# Patient Record
Sex: Male | Born: 1965 | Race: White | Hispanic: No | Marital: Married | Smoking: Never smoker
Health system: Southern US, Community
[De-identification: ages and names within clinical notes are randomized; demographics above are authoritative.]

## PROBLEM LIST (undated history)

## (undated) DIAGNOSIS — N183 Chronic kidney disease, stage 3 unspecified: Secondary | ICD-10-CM

## (undated) DIAGNOSIS — I10 Essential (primary) hypertension: Secondary | ICD-10-CM

## (undated) DIAGNOSIS — I4892 Unspecified atrial flutter: Secondary | ICD-10-CM

## (undated) DIAGNOSIS — K469 Unspecified abdominal hernia without obstruction or gangrene: Secondary | ICD-10-CM

## (undated) DIAGNOSIS — N200 Calculus of kidney: Secondary | ICD-10-CM

## (undated) DIAGNOSIS — I517 Cardiomegaly: Secondary | ICD-10-CM

## (undated) DIAGNOSIS — R0683 Snoring: Secondary | ICD-10-CM

## (undated) HISTORY — PX: HERNIA REPAIR: SHX51

## (undated) HISTORY — DX: Morbid (severe) obesity due to excess calories: E66.01

## (undated) HISTORY — PX: KNEE SURGERY: SHX244

## (undated) HISTORY — DX: Snoring: R06.83

## (undated) HISTORY — DX: Unspecified atrial flutter: I48.92

## (undated) HISTORY — DX: Cardiomegaly: I51.7

## (undated) HISTORY — DX: Chronic kidney disease, stage 3 unspecified: N18.30

## (undated) HISTORY — PX: HAND SURGERY: SHX662

---

## 2009-07-20 ENCOUNTER — Emergency Department (HOSPITAL_COMMUNITY): Admission: EM | Admit: 2009-07-20 | Discharge: 2009-07-21 | Payer: Self-pay | Admitting: Emergency Medicine

## 2009-10-15 ENCOUNTER — Emergency Department (HOSPITAL_COMMUNITY): Admission: EM | Admit: 2009-10-15 | Discharge: 2009-10-16 | Payer: Self-pay | Admitting: Emergency Medicine

## 2010-01-20 ENCOUNTER — Emergency Department (HOSPITAL_COMMUNITY)
Admission: EM | Admit: 2010-01-20 | Discharge: 2010-01-20 | Payer: Self-pay | Source: Home / Self Care | Admitting: Emergency Medicine

## 2010-01-22 LAB — CBC
HCT: 38.3 % — ABNORMAL LOW (ref 39.0–52.0)
Hemoglobin: 13.2 g/dL (ref 13.0–17.0)
MCH: 30.7 pg (ref 26.0–34.0)
MCHC: 34.5 g/dL (ref 30.0–36.0)
MCV: 89.1 fL (ref 78.0–100.0)
Platelets: 151 10*3/uL (ref 150–400)
RBC: 4.3 MIL/uL (ref 4.22–5.81)
RDW: 12.3 % (ref 11.5–15.5)
WBC: 7.8 10*3/uL (ref 4.0–10.5)

## 2010-01-22 LAB — COMPREHENSIVE METABOLIC PANEL
ALT: 26 U/L (ref 0–53)
AST: 19 U/L (ref 0–37)
Albumin: 3.7 g/dL (ref 3.5–5.2)
Alkaline Phosphatase: 73 U/L (ref 39–117)
BUN: 21 mg/dL (ref 6–23)
CO2: 24 mEq/L (ref 19–32)
Calcium: 8.8 mg/dL (ref 8.4–10.5)
Chloride: 103 mEq/L (ref 96–112)
Creatinine, Ser: 1.19 mg/dL (ref 0.4–1.5)
GFR calc Af Amer: >60 mL/min (ref >60)
GFR calc non Af Amer: >60 mL/min (ref >60)
Glucose, Bld: 107 mg/dL — ABNORMAL HIGH (ref 70–99)
Potassium: 4.5 mEq/L (ref 3.5–5.1)
Sodium: 136 mEq/L (ref 135–145)
Total Bilirubin: 1 mg/dL (ref 0.3–1.2)
Total Protein: 6.9 g/dL (ref 6.0–8.3)

## 2010-01-22 LAB — POCT CARDIAC MARKERS
CKMB, poc: <1 ng/mL — ABNORMAL LOW (ref 1.0–8.0)
CKMB, poc: <1 ng/mL — ABNORMAL LOW (ref 1.0–8.0)
Myoglobin, poc: 66.8 ng/mL (ref 12–200)
Myoglobin, poc: 79.5 ng/mL (ref 12–200)
Troponin i, poc: <0.05 ng/mL (ref 0.00–0.09)
Troponin i, poc: <0.05 ng/mL (ref 0.00–0.09)

## 2010-01-22 LAB — DIFFERENTIAL
Basophils Absolute: 0 10*3/uL (ref 0.0–0.1)
Basophils Relative: 0 % (ref 0–1)
Eosinophils Absolute: 0.2 10*3/uL (ref 0.0–0.7)
Eosinophils Relative: 3 % (ref 0–5)
Lymphocytes Relative: 18 % (ref 12–46)
Lymphs Abs: 1.4 10*3/uL (ref 0.7–4.0)
Monocytes Absolute: 0.6 10*3/uL (ref 0.1–1.0)
Monocytes Relative: 7 % (ref 3–12)
Neutro Abs: 5.6 10*3/uL (ref 1.7–7.7)
Neutrophils Relative %: 72 % (ref 43–77)

## 2010-03-09 ENCOUNTER — Emergency Department (HOSPITAL_COMMUNITY): Payer: Self-pay

## 2010-03-09 ENCOUNTER — Emergency Department (HOSPITAL_COMMUNITY)
Admission: EM | Admit: 2010-03-09 | Discharge: 2010-03-09 | Disposition: A | Discharge status: Home / Self Care | Payer: Self-pay | Attending: Emergency Medicine | Admitting: Emergency Medicine

## 2010-03-09 DIAGNOSIS — R42 Dizziness and giddiness: Secondary | ICD-10-CM | POA: Insufficient documentation

## 2010-03-09 DIAGNOSIS — I10 Essential (primary) hypertension: Secondary | ICD-10-CM | POA: Insufficient documentation

## 2010-03-09 LAB — COMPREHENSIVE METABOLIC PANEL
ALT: 33 U/L (ref 0–53)
AST: 30 U/L (ref 0–37)
Albumin: 3.5 g/dL (ref 3.5–5.2)
Alkaline Phosphatase: 85 U/L (ref 39–117)
BUN: 15 mg/dL (ref 6–23)
CO2: 25 mEq/L (ref 19–32)
Calcium: 8.6 mg/dL (ref 8.4–10.5)
Chloride: 98 mEq/L (ref 96–112)
Creatinine, Ser: 1.15 mg/dL (ref 0.4–1.5)
GFR calc Af Amer: >60 mL/min (ref >60)
GFR calc non Af Amer: >60 mL/min (ref >60)
Glucose, Bld: 106 mg/dL — ABNORMAL HIGH (ref 70–99)
Potassium: 4.1 mEq/L (ref 3.5–5.1)
Sodium: 131 mEq/L — ABNORMAL LOW (ref 135–145)
Total Bilirubin: 0.7 mg/dL (ref 0.3–1.2)
Total Protein: 7.1 g/dL (ref 6.0–8.3)

## 2010-03-09 LAB — POCT CARDIAC MARKERS
CKMB, poc: 1.1 ng/mL (ref 1.0–8.0)
Myoglobin, poc: 102 ng/mL (ref 12–200)
Troponin i, poc: <0.05 ng/mL (ref 0.00–0.09)

## 2010-03-09 LAB — CBC
HCT: 42 % (ref 39.0–52.0)
Hemoglobin: 14.2 g/dL (ref 13.0–17.0)
MCH: 30.3 pg (ref 26.0–34.0)
MCHC: 33.8 g/dL (ref 30.0–36.0)
MCV: 89.6 fL (ref 78.0–100.0)
Platelets: 194 10*3/uL (ref 150–400)
RBC: 4.69 MIL/uL (ref 4.22–5.81)
RDW: 12.3 % (ref 11.5–15.5)
WBC: 10.2 10*3/uL (ref 4.0–10.5)

## 2010-03-20 LAB — BASIC METABOLIC PANEL
BUN: 20 mg/dL (ref 6–23)
CO2: 26 mEq/L (ref 19–32)
Calcium: 8.9 mg/dL (ref 8.4–10.5)
Chloride: 103 mEq/L (ref 96–112)
Creatinine, Ser: 1.19 mg/dL (ref 0.4–1.5)
GFR calc Af Amer: >60 mL/min (ref >60)
GFR calc non Af Amer: >60 mL/min (ref >60)
Glucose, Bld: 126 mg/dL — ABNORMAL HIGH (ref 70–99)
Potassium: 3.7 mEq/L (ref 3.5–5.1)
Sodium: 137 mEq/L (ref 135–145)

## 2010-03-20 LAB — POCT CARDIAC MARKERS
CKMB, poc: 1.1 ng/mL (ref 1.0–8.0)
Myoglobin, poc: 92.4 ng/mL (ref 12–200)
Troponin i, poc: <0.05 ng/mL (ref 0.00–0.09)

## 2010-03-20 LAB — CBC
HCT: 37.3 % — ABNORMAL LOW (ref 39.0–52.0)
Hemoglobin: 13 g/dL (ref 13.0–17.0)
MCH: 31.8 pg (ref 26.0–34.0)
MCHC: 34.8 g/dL (ref 30.0–36.0)
MCV: 91.4 fL (ref 78.0–100.0)
Platelets: 158 10*3/uL (ref 150–400)
RBC: 4.08 MIL/uL — ABNORMAL LOW (ref 4.22–5.81)
RDW: 12.3 % (ref 11.5–15.5)
WBC: 10.8 10*3/uL — ABNORMAL HIGH (ref 4.0–10.5)

## 2010-03-20 LAB — DIFFERENTIAL
Basophils Absolute: 0 10*3/uL (ref 0.0–0.1)
Basophils Relative: 0 % (ref 0–1)
Eosinophils Absolute: 0.6 10*3/uL (ref 0.0–0.7)
Eosinophils Relative: 6 % — ABNORMAL HIGH (ref 0–5)
Lymphocytes Relative: 25 % (ref 12–46)
Lymphs Abs: 2.7 10*3/uL (ref 0.7–4.0)
Monocytes Absolute: 1.1 10*3/uL — ABNORMAL HIGH (ref 0.1–1.0)
Monocytes Relative: 10 % (ref 3–12)
Neutro Abs: 6.3 10*3/uL (ref 1.7–7.7)
Neutrophils Relative %: 58 % (ref 43–77)

## 2010-03-22 LAB — DIFFERENTIAL
Basophils Absolute: 0 10*3/uL (ref 0.0–0.1)
Basophils Relative: 0 % (ref 0–1)
Eosinophils Absolute: 0.6 10*3/uL (ref 0.0–0.7)
Eosinophils Relative: 6 % — ABNORMAL HIGH (ref 0–5)
Lymphocytes Relative: 22 % (ref 12–46)
Lymphs Abs: 2.3 10*3/uL (ref 0.7–4.0)
Monocytes Absolute: 0.9 10*3/uL (ref 0.1–1.0)
Monocytes Relative: 9 % (ref 3–12)
Neutro Abs: 6.4 10*3/uL (ref 1.7–7.7)
Neutrophils Relative %: 63 % (ref 43–77)

## 2010-03-22 LAB — CBC
HCT: 37.8 % — ABNORMAL LOW (ref 39.0–52.0)
Hemoglobin: 13 g/dL (ref 13.0–17.0)
MCH: 31.6 pg (ref 26.0–34.0)
MCHC: 34.3 g/dL (ref 30.0–36.0)
MCV: 92.1 fL (ref 78.0–100.0)
Platelets: 151 10*3/uL (ref 150–400)
RBC: 4.1 MIL/uL — ABNORMAL LOW (ref 4.22–5.81)
RDW: 12.6 % (ref 11.5–15.5)
WBC: 10.1 10*3/uL (ref 4.0–10.5)

## 2010-03-22 LAB — BASIC METABOLIC PANEL
BUN: 15 mg/dL (ref 6–23)
CO2: 26 mEq/L (ref 19–32)
Calcium: 8.7 mg/dL (ref 8.4–10.5)
Chloride: 104 mEq/L (ref 96–112)
Creatinine, Ser: 1.19 mg/dL (ref 0.4–1.5)
GFR calc Af Amer: >60 mL/min (ref >60)
GFR calc non Af Amer: >60 mL/min (ref >60)
Glucose, Bld: 106 mg/dL — ABNORMAL HIGH (ref 70–99)
Potassium: 4.2 mEq/L (ref 3.5–5.1)
Sodium: 137 mEq/L (ref 135–145)

## 2010-03-22 LAB — POCT CARDIAC MARKERS
CKMB, poc: 1.7 ng/mL (ref 1.0–8.0)
Myoglobin, poc: 106 ng/mL (ref 12–200)
Troponin i, poc: <0.05 ng/mL (ref 0.00–0.09)

## 2010-06-12 ENCOUNTER — Emergency Department (HOSPITAL_COMMUNITY)
Admission: EM | Admit: 2010-06-12 | Discharge: 2010-06-12 | Disposition: A | Discharge status: Home / Self Care | Payer: Self-pay | Attending: Emergency Medicine | Admitting: Emergency Medicine

## 2010-06-12 DIAGNOSIS — R42 Dizziness and giddiness: Secondary | ICD-10-CM | POA: Insufficient documentation

## 2010-06-12 DIAGNOSIS — I10 Essential (primary) hypertension: Secondary | ICD-10-CM | POA: Insufficient documentation

## 2010-06-12 LAB — COMPREHENSIVE METABOLIC PANEL
ALT: 30 U/L (ref 0–53)
AST: 22 U/L (ref 0–37)
Albumin: 4 g/dL (ref 3.5–5.2)
Alkaline Phosphatase: 85 U/L (ref 39–117)
BUN: 30 mg/dL — ABNORMAL HIGH (ref 6–23)
CO2: 25 mEq/L (ref 19–32)
Calcium: 9.7 mg/dL (ref 8.4–10.5)
Chloride: 100 mEq/L (ref 96–112)
Creatinine, Ser: 2.07 mg/dL — ABNORMAL HIGH (ref 0.4–1.5)
GFR calc Af Amer: 42 mL/min — ABNORMAL LOW (ref >60)
GFR calc non Af Amer: 35 mL/min — ABNORMAL LOW (ref >60)
Glucose, Bld: 100 mg/dL — ABNORMAL HIGH (ref 70–99)
Potassium: 3.7 mEq/L (ref 3.5–5.1)
Sodium: 137 mEq/L (ref 135–145)
Total Bilirubin: 0.7 mg/dL (ref 0.3–1.2)
Total Protein: 7.6 g/dL (ref 6.0–8.3)

## 2010-06-12 LAB — DIFFERENTIAL
Basophils Absolute: 0 10*3/uL (ref 0.0–0.1)
Basophils Relative: 0 % (ref 0–1)
Eosinophils Absolute: 0.5 10*3/uL (ref 0.0–0.7)
Eosinophils Relative: 5 % (ref 0–5)
Lymphocytes Relative: 30 % (ref 12–46)
Lymphs Abs: 2.9 10*3/uL (ref 0.7–4.0)
Monocytes Absolute: 0.9 10*3/uL (ref 0.1–1.0)
Monocytes Relative: 10 % (ref 3–12)
Neutro Abs: 5.2 10*3/uL (ref 1.7–7.7)
Neutrophils Relative %: 55 % (ref 43–77)

## 2010-06-12 LAB — CBC
HCT: 41.3 % (ref 39.0–52.0)
Hemoglobin: 13.7 g/dL (ref 13.0–17.0)
MCH: 29.7 pg (ref 26.0–34.0)
MCHC: 33.2 g/dL (ref 30.0–36.0)
MCV: 89.6 fL (ref 78.0–100.0)
Platelets: 176 10*3/uL (ref 150–400)
RBC: 4.61 MIL/uL (ref 4.22–5.81)
RDW: 12.4 % (ref 11.5–15.5)
WBC: 9.5 10*3/uL (ref 4.0–10.5)

## 2010-06-12 LAB — TROPONIN I: Troponin I: <0.3 ng/mL (ref <0.30)

## 2010-12-22 ENCOUNTER — Encounter: Payer: Self-pay | Admitting: *Deleted

## 2010-12-22 ENCOUNTER — Emergency Department (HOSPITAL_COMMUNITY)
Admission: EM | Admit: 2010-12-22 | Discharge: 2010-12-23 | Disposition: A | Discharge status: Home / Self Care | Payer: Self-pay | Attending: Emergency Medicine | Admitting: Emergency Medicine

## 2010-12-22 ENCOUNTER — Other Ambulatory Visit: Payer: Self-pay

## 2010-12-22 DIAGNOSIS — K219 Gastro-esophageal reflux disease without esophagitis: Secondary | ICD-10-CM | POA: Insufficient documentation

## 2010-12-22 DIAGNOSIS — R142 Eructation: Secondary | ICD-10-CM | POA: Insufficient documentation

## 2010-12-22 DIAGNOSIS — R0789 Other chest pain: Secondary | ICD-10-CM | POA: Insufficient documentation

## 2010-12-22 DIAGNOSIS — K429 Umbilical hernia without obstruction or gangrene: Secondary | ICD-10-CM | POA: Insufficient documentation

## 2010-12-22 DIAGNOSIS — I10 Essential (primary) hypertension: Secondary | ICD-10-CM | POA: Insufficient documentation

## 2010-12-22 DIAGNOSIS — R141 Gas pain: Secondary | ICD-10-CM | POA: Insufficient documentation

## 2010-12-22 HISTORY — DX: Essential (primary) hypertension: I10

## 2010-12-22 NOTE — ED Notes (Signed)
Reports presently experiencing some heartburn.

## 2010-12-22 NOTE — ED Notes (Signed)
Pt reports he was watching TV, states that he experienced some CP, SOB, elevated BP and HR. Also experienced numbness in left arm and leg.  Denies any nausea.  At present time, not experiencing any sharp CP, or SOB.

## 2010-12-22 NOTE — ED Provider Notes (Signed)
History     CSN: [id number] Arrival date & time: 12/22/2010 11:30 PM   First MD Initiated Contact with Patient 12/22/10 2347      Chief Complaint  Patient presents with  . Chest Pain    (Consider location/radiation/quality/duration/timing/severity/associated sxs/prior treatment) HPI  Patient relates tonight night he was sitting in his recliner watching TV and had acute onset of left shoulder/left upper chest discomfort which sharp. He states it happened about 9 PM. It was associated with tingling and numbness in his left arm and his left leg. He states his chest felt tight and then he had indigestion which means belching. He denied nausea or vomiting but his wife said he was clammy and he felt like his heart was racing with shortness of breath. She took his blood pressure and it was 159/90 with a heart rate of 124. He states it lasted about 30 minutes. He states earlier in the evening he had some cough and clear rhinorrhea without fever. He states he's been noticing a dull ache in his left leg. He states about one week ago he and his boss ate at a restaurant and then they both developed abdominal pain with watery diarrhea which is resolved. He states he's never had anything like this before.  PCP Dr. Lysbeth Galas at [facility]  Past Medical History  Diagnosis Date  . Hypertension    periumbilical hernia  Past Surgical History  Procedure Date  . Knee surgery     History reviewed. No pertinent family history. Father died at age 36 from heart disease Mother died at age 21 from cancer  History  Substance Use Topics  . Smoking status: Never Smoker   . Smokeless tobacco: Not on file  . Alcohol Use: No   patient employed Patient lives with spouse    Review of Systems  All other systems reviewed and are negative.    Allergies  Review of patient's allergies indicates no known allergies.  Home Medications   Current Outpatient Rx  Name Route Sig Dispense Refill  .  LISINOPRIL 20 MG PO TABS Oral Take 20 mg by mouth daily.      Marland Kitchen RANITIDINE HCL 150 MG PO TABS Oral Take 150 mg by mouth 2 (two) times daily.        BP 138/75  Pulse 100  Temp 98.2 F (36.8 C)  Resp 18  Ht 5\' 11"  (1.803 m)  Wt 210 lb (95.255 kg)  BMI 29.29 kg/m2  SpO2 100%  Vital signs show borderline tachycardia otherwise normal  Physical Exam  Nursing note and vitals reviewed. Constitutional: He is oriented to person, place, and time. He appears well-developed and well-nourished.  Non-toxic appearance. He does not appear ill. No distress.       Patient is noted to have some belching  HENT:  Head: Normocephalic and atraumatic.  Right Ear: External ear normal.  Left Ear: External ear normal.  Nose: Nose normal. No mucosal edema or rhinorrhea.  Mouth/Throat: Oropharynx is clear and moist and mucous membranes are normal. No dental abscesses or uvula swelling.  Eyes: Conjunctivae and EOM are normal. Pupils are equal, round, and reactive to light.  Neck: Normal range of motion and full passive range of motion without pain. Neck supple.  Cardiovascular: Normal rate, regular rhythm and normal heart sounds.  Exam reveals no gallop and no friction rub.   No murmur heard. Pulmonary/Chest: Effort normal and breath sounds normal. No respiratory distress. He has no wheezes. He has no rhonchi. He  has no rales. He exhibits no tenderness and no crepitus.  Abdominal: Soft. Normal appearance and bowel sounds are normal. He exhibits no distension. There is no tenderness. There is no rebound and no guarding.       Patient has a large soft  Umbilical hernia that he's had for the past 3 years  Musculoskeletal: Normal range of motion. He exhibits no edema and no tenderness.       Moves all extremities well.   Neurological: He is alert and oriented to person, place, and time. He has normal strength. No cranial nerve deficit.  Skin: Skin is warm, dry and intact. No rash noted. No erythema. No pallor.    Psychiatric: He has a normal mood and affect. His speech is normal and behavior is normal. His mood appears not anxious.    ED Course  Procedures (including critical care time)   Patient given aspirin 324 mg to chew. He had nitroglycerin ointment placed on his chest and is given a GI cocktail. Pt relates his pain is gone.  We discussed he should have a stress test done b/o his family hx of CAD although his tests tonight are normal.   Results for orders placed during the hospital encounter of 12/22/10  CBC      Component Value Range   WBC 8.5  4.0 - 10.5 (K/uL)   RBC 4.23  4.22 - 5.81 (MIL/uL)   Hemoglobin 13.0  13.0 - 17.0 (g/dL)   HCT 46.9 (*) 62.9 - 52.0 (%)   MCV 89.6  78.0 - 100.0 (fL)   MCH 30.7  26.0 - 34.0 (pg)   MCHC 34.3  30.0 - 36.0 (g/dL)   RDW 52.8  41.3 - 24.4 (%)   Platelets 180  150 - 400 (K/uL)  BASIC METABOLIC PANEL      Component Value Range   Sodium 137  135 - 145 (mEq/L)   Potassium 3.7  3.5 - 5.1 (mEq/L)   Chloride 105  96 - 112 (mEq/L)   CO2 25  19 - 32 (mEq/L)   Glucose, Bld 105 (*) 70 - 99 (mg/dL)   BUN 21  6 - 23 (mg/dL)   Creatinine, Ser 0.10  0.50 - 1.35 (mg/dL)   Calcium 9.5  8.4 - 27.2 (mg/dL)   GFR calc non Af Amer 73 (*) >90 (mL/min)   GFR calc Af Amer 85 (*) >90 (mL/min)  CARDIAC PANEL(CRET KIN+CKTOT+MB+TROPI)      Component Value Range   Total CK 152  7 - 232 (U/L)   CK, MB 3.2  0.3 - 4.0 (ng/mL)   Troponin I <0.30  <0.30 (ng/mL)   Relative Index 2.1  0.0 - 2.5   POCT I-STAT TROPONIN I      Component Value Range   Troponin i, poc 0.00  0.00 - 0.08 (ng/mL)   Comment 3            Laboratory interpretation all normal  Dg Chest Portable 1 View  12/23/2010  *RADIOLOGY REPORT*  Clinical Data: Chest pain, shortness of breath  PORTABLE CHEST - 1 VIEW  Comparison: 01/20/2010  Findings: Lungs are clear. No pleural effusion or pneumothorax.  The heart is top normal in size.  IMPRESSION: No evidence of acute cardiopulmonary disease.  Original  Report Authenticated By: Charline Bills, M.D.    Date: 12/23/2010  Rate: 88  Rhythm: normal sinus rhythm  QRS Axis: normal  Intervals: normal  ST/T Wave abnormalities: normal  Conduction Disutrbances:none  Narrative Interpretation:  Old EKG Reviewed: unchanged from 06/12/2010   1. Chest pain, atypical   2. GERD (gastroesophageal reflux disease)     New Prescriptions   TRAMADOL-ACETAMINOPHEN (ULTRACET) 37.5-325 MG PER TABLET    2 tabs po QID prn pain   Plan discharge Recommend stress test   Devoria Albe, MD, FACEP   MDM          Ward Givens, MD 12/23/10 [phone]

## 2010-12-23 ENCOUNTER — Emergency Department (HOSPITAL_COMMUNITY): Payer: Self-pay

## 2010-12-23 LAB — CBC
HCT: 37.9 % — ABNORMAL LOW (ref 39.0–52.0)
Hemoglobin: 13 g/dL (ref 13.0–17.0)
MCV: 89.6 fL (ref 78.0–100.0)
RDW: 12.7 % (ref 11.5–15.5)
WBC: 8.5 10*3/uL (ref 4.0–10.5)

## 2010-12-23 LAB — BASIC METABOLIC PANEL
BUN: 21 mg/dL (ref 6–23)
Chloride: 105 mEq/L (ref 96–112)
Creatinine, Ser: 1.17 mg/dL (ref 0.50–1.35)
GFR calc Af Amer: 85 mL/min — ABNORMAL LOW (ref >90)
Glucose, Bld: 105 mg/dL — ABNORMAL HIGH (ref 70–99)
Potassium: 3.7 mEq/L (ref 3.5–5.1)

## 2010-12-23 LAB — POCT I-STAT TROPONIN I: Troponin i, poc: 0 ng/mL (ref 0.00–0.08)

## 2010-12-23 LAB — CARDIAC PANEL(CRET KIN+CKTOT+MB+TROPI)
Relative Index: 2.1 (ref 0.0–2.5)
Troponin I: <0.3 ng/mL (ref <0.30)

## 2010-12-23 MED ORDER — GI COCKTAIL ~~LOC~~
30.0000 mL | Freq: Once | ORAL | Status: AC
  Administered 2010-12-23: 30 mL via ORAL
  Filled 2010-12-23: qty 30

## 2010-12-23 MED ORDER — NITROGLYCERIN 2 % TD OINT
1.0000 [in_us] | TOPICAL_OINTMENT | Freq: Once | TRANSDERMAL | Status: AC
  Administered 2010-12-23: 1 [in_us] via TOPICAL
  Filled 2010-12-23: qty 30

## 2010-12-23 MED ORDER — ASPIRIN 81 MG PO CHEW: 324.0000 mg | CHEWABLE_TABLET | Freq: Once | ORAL | Status: DC

## 2010-12-23 MED ORDER — TRAMADOL-ACETAMINOPHEN 37.5-325 MG PO TABS: ORAL_TABLET | ORAL | Status: AC

## 2010-12-23 MED ORDER — ASPIRIN 81 MG PO CHEW
324.0000 mg | CHEWABLE_TABLET | Freq: Once | ORAL | Status: AC
  Administered 2010-12-23: 324 mg via ORAL
  Filled 2010-12-23: qty 4

## 2011-01-12 ENCOUNTER — Emergency Department (HOSPITAL_COMMUNITY)
Admission: EM | Admit: 2011-01-12 | Discharge: 2011-01-12 | Disposition: A | Discharge status: Home / Self Care | Payer: Self-pay | Attending: Emergency Medicine | Admitting: Emergency Medicine

## 2011-01-12 ENCOUNTER — Encounter (HOSPITAL_COMMUNITY): Payer: Self-pay | Admitting: *Deleted

## 2011-01-12 ENCOUNTER — Other Ambulatory Visit: Payer: Self-pay

## 2011-01-12 ENCOUNTER — Emergency Department (HOSPITAL_COMMUNITY): Payer: Self-pay

## 2011-01-12 DIAGNOSIS — M25519 Pain in unspecified shoulder: Secondary | ICD-10-CM | POA: Insufficient documentation

## 2011-01-12 DIAGNOSIS — M5380 Other specified dorsopathies, site unspecified: Secondary | ICD-10-CM | POA: Insufficient documentation

## 2011-01-12 DIAGNOSIS — I10 Essential (primary) hypertension: Secondary | ICD-10-CM | POA: Insufficient documentation

## 2011-01-12 DIAGNOSIS — I498 Other specified cardiac arrhythmias: Secondary | ICD-10-CM | POA: Insufficient documentation

## 2011-01-12 DIAGNOSIS — M62830 Muscle spasm of back: Secondary | ICD-10-CM

## 2011-01-12 MED ORDER — KETOROLAC TROMETHAMINE 60 MG/2ML IM SOLN
60.0000 mg | Freq: Once | INTRAMUSCULAR | Status: AC
  Administered 2011-01-12: 60 mg via INTRAMUSCULAR
  Filled 2011-01-12: qty 2

## 2011-01-12 MED ORDER — NAPROXEN 500 MG PO TABS: 500.0000 mg | ORAL_TABLET | Freq: Two times a day (BID) | ORAL | Status: DC

## 2011-01-12 MED ORDER — DIAZEPAM 5 MG PO TABS
5.0000 mg | ORAL_TABLET | Freq: Once | ORAL | Status: AC
  Administered 2011-01-12: 5 mg via ORAL
  Filled 2011-01-12: qty 1

## 2011-01-12 MED ORDER — DIAZEPAM 5 MG PO TABS: 5.0000 mg | ORAL_TABLET | Freq: Three times a day (TID) | ORAL | Status: AC | PRN

## 2011-01-12 NOTE — ED Provider Notes (Signed)
History     CSN: [id number]  Arrival date & time 01/12/11  1034   First MD Initiated Contact with Patient 01/12/11 1152      Chief Complaint  Patient presents with  . Shoulder Pain    (Consider location/radiation/quality/duration/timing/severity/associated sxs/prior treatment) Patient is a 46 y.o. male presenting with shoulder pain. The history is provided by the patient.  Shoulder Pain This is a new problem. The current episode started today. The problem has been unchanged. Associated symptoms include arthralgias. Pertinent negatives include no abdominal pain, chest pain, congestion, coughing, fever, headaches, joint swelling, nausea, neck pain, numbness, rash, sore throat or weakness. 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  He denies chest and shortness of breath,  But taking a deep breath,  In addition to movement of the right arm at the shoulder worsens his pain.. Exacerbated by: Deep inspiration and right shoulder movement worsens the pain. He has tried nothing for the symptoms.    Past Medical History  Diagnosis Date  . Hypertension     Past Surgical History  Procedure Date  . Knee surgery     No family history on file.  History  Substance Use Topics  . Smoking status: Never Smoker   . Smokeless tobacco: Not on file  . Alcohol Use: No      Review of Systems  Constitutional: Negative for fever.  HENT: Negative for congestion, sore throat and neck pain.   Eyes: Negative.   Respiratory: Negative for cough, chest tightness, shortness of breath, wheezing and stridor.   Cardiovascular: Negative for chest pain.  Gastrointestinal: Negative for nausea and abdominal pain.  Genitourinary: Negative.   Musculoskeletal: Positive for back pain and arthralgias. Negative for joint swelling.  Skin: Negative.  Negative for rash and wound.  Neurological:  Negative for dizziness, weakness, light-headedness, numbness and headaches.  Hematological: Negative.   Psychiatric/Behavioral: Negative.     Allergies  Review of patient's allergies indicates no known allergies.  Home Medications   Current Outpatient Rx  Name Route Sig Dispense Refill  . DIAZEPAM 5 MG PO TABS Oral Take 1 tablet (5 mg total) by mouth every 8 (eight) hours as needed (for muscle spasm). 15 tablet 0  . LISINOPRIL 20 MG PO TABS Oral Take 20 mg by mouth daily.      Marland Kitchen NAPROXEN 500 MG PO TABS Oral Take 1 tablet (500 mg total) by mouth 2 (two) times daily with a meal. 20 tablet 0  . RANITIDINE HCL 150 MG PO TABS Oral Take 150 mg by mouth 2 (two) times daily.        BP 125/84  Pulse 68  Temp(Src) 98.2 F (36.8 C) (Oral)  Resp 20  Ht 6' (1.829 m)  Wt 210 lb (95.255 kg)  BMI 28.48 kg/m2  SpO2 100%  Physical Exam  Nursing note and vitals reviewed. Constitutional: He is oriented to person, place, and time. He appears well-developed and well-nourished.  HENT:  Head: Normocephalic and atraumatic.  Eyes: Conjunctivae are normal.  Neck: Normal range of motion.  Cardiovascular: Normal rate, regular rhythm, normal heart sounds and intact distal pulses.   No murmur heard. Pulmonary/Chest: Effort normal and breath sounds normal. He has no wheezes. He exhibits no tenderness.  Abdominal: Soft. Bowel sounds are normal. There is no tenderness.  Musculoskeletal: Normal range of motion.       Right shoulder:  He exhibits pain. He exhibits no tenderness, no bony tenderness, no swelling, no crepitus, no deformity, no spasm, normal pulse and normal strength.       Thoracic back: He exhibits tenderness and spasm. He exhibits no swelling, no edema and no deformity.       Back:  Neurological: He is alert and oriented to person, place, and time.  Skin: Skin is warm and dry.  Psychiatric: He has a normal mood and affect.    ED Course  Procedures (including critical care time)  Labs  Reviewed - No data to display Dg Chest 2 View  01/12/2011  *RADIOLOGY REPORT*  Clinical Data: Mid back pain.  CHEST - 2 VIEW  Comparison: 12/23/2010 and 01/20/2010  Findings: Heart size and vascularity are normal and the lungs are clear.  No osseous abnormality.  No effusions.  IMPRESSION: Normal chest.  Original Report Authenticated By: Gwynn Burly, M.D.     1. Muscle spasm of back    Pain almost resolved after receiving toradol 60 IM and valium 5 po.   MDM   Date: 01/12/2011  Rate: 58  Rhythm: sinus bradycardia  QRS Axis: normal  Intervals: normal  ST/T Wave abnormalities: normal  Conduction Disutrbances:none  Narrative Interpretation:   Old EKG Reviewed: unchanged   Sudden onset reproducible right upper scapular and upper muscular pain and spasm.  Vital signs normal with no risk factors for dvt/pe.           Candis Musa, PA 01/12/11 2053  Candis Musa, PA 01/12/11 2055

## 2011-01-12 NOTE — ED Notes (Signed)
Pain in right shoulder radiating into back, shot of breath

## 2011-01-14 NOTE — ED Provider Notes (Signed)
Medical screening examination/treatment/procedure(s) were performed by non-physician practitioner and as supervising physician I was immediately available for consultation/collaboration.   Orell Hurtado W Natajah Derderian, MD 01/14/11 1523 

## 2011-03-29 ENCOUNTER — Emergency Department (HOSPITAL_COMMUNITY)
Admission: EM | Admit: 2011-03-29 | Discharge: 2011-03-29 | Disposition: A | Discharge status: Home / Self Care | Payer: Self-pay | Attending: Emergency Medicine | Admitting: Emergency Medicine

## 2011-03-29 ENCOUNTER — Other Ambulatory Visit: Payer: Self-pay

## 2011-03-29 ENCOUNTER — Emergency Department (HOSPITAL_COMMUNITY): Payer: Self-pay

## 2011-03-29 ENCOUNTER — Encounter (HOSPITAL_COMMUNITY): Payer: Self-pay | Admitting: *Deleted

## 2011-03-29 DIAGNOSIS — R55 Syncope and collapse: Secondary | ICD-10-CM | POA: Insufficient documentation

## 2011-03-29 DIAGNOSIS — I10 Essential (primary) hypertension: Secondary | ICD-10-CM | POA: Insufficient documentation

## 2011-03-29 DIAGNOSIS — R079 Chest pain, unspecified: Secondary | ICD-10-CM | POA: Insufficient documentation

## 2011-03-29 DIAGNOSIS — R110 Nausea: Secondary | ICD-10-CM | POA: Insufficient documentation

## 2011-03-29 HISTORY — DX: Unspecified abdominal hernia without obstruction or gangrene: K46.9

## 2011-03-29 LAB — CBC
MCH: 29.9 pg (ref 26.0–34.0)
MCHC: 33.1 g/dL (ref 30.0–36.0)
MCV: 90.3 fL (ref 78.0–100.0)
Platelets: 170 10*3/uL (ref 150–400)
RDW: 12.7 % (ref 11.5–15.5)

## 2011-03-29 LAB — DIFFERENTIAL
Basophils Absolute: 0.1 10*3/uL (ref 0.0–0.1)
Eosinophils Absolute: 0.3 10*3/uL (ref 0.0–0.7)
Eosinophils Relative: 4 % (ref 0–5)

## 2011-03-29 LAB — BASIC METABOLIC PANEL
Calcium: 8.9 mg/dL (ref 8.4–10.5)
GFR calc non Af Amer: 70 mL/min — ABNORMAL LOW (ref >90)
Glucose, Bld: 120 mg/dL — ABNORMAL HIGH (ref 70–99)
Sodium: 134 mEq/L — ABNORMAL LOW (ref 135–145)

## 2011-03-29 LAB — TROPONIN I: Troponin I: 0.3 ng/mL (ref <0.30)

## 2011-03-29 MED ORDER — ASPIRIN 81 MG PO CHEW
324.0000 mg | CHEWABLE_TABLET | Freq: Once | ORAL | Status: AC
  Administered 2011-03-29: 324 mg via ORAL
  Filled 2011-03-29: qty 4

## 2011-03-29 NOTE — ED Provider Notes (Signed)
History   This chart was scribed for Joya Gaskins, MD by Melba Coon. The patient was seen in room APA17/APA17 and the patient's care was started at 12:24PM.    CSN: [id number]  Arrival date & time 03/29/11  1132   First MD Initiated Contact with Patient 03/29/11 1210      Chief Complaint  Patient presents with  . Chest Pain  . Nausea     HPI Mitchell Nichols is a 46 y.o. male who presents to the Emergency Department complaining of constant, moderate to severe chest pain, tightness, and numbness with an onset just prior to arrival.   He reports it started while driving to the ER for hand numbness, and now it is gone at the time of exam.   Pt was actually presenting to the ED for left hand numbness and tingling with an onset around 9:00AM, but PTA while driving to the ED, chest symptoms started. No medications have been taken for the symptoms. Pt has not been recently traveling recently NO sob NO VOMITING No pleuritic type pain  No HA or LOC. Hx of HTN; lisinopril 20mg /day; No h/o diabetes No fam h/o CAD He has not had these symptoms recently Denies known h/o CVA/CAD/PE/DVT  PCP: Dr. Lysbeth Galas  Past Medical History  Diagnosis Date  . Hypertension   . Hernia     Past Surgical History  Procedure Date  . Knee surgery     No family history on file.  History  Substance Use Topics  . Smoking status: Never Smoker   . Smokeless tobacco: Not on file  . Alcohol Use: No      Review of Systems 10 Systems reviewed and are negative for acute change except as noted in the HPI.  Allergies  Review of patient's allergies indicates no known allergies.  Home Medications   Current Outpatient Rx  Name Route Sig Dispense Refill  . LISINOPRIL 20 MG PO TABS Oral Take 20 mg by mouth daily.      Marland Kitchen NAPROXEN 500 MG PO TABS Oral Take 500 mg by mouth 2 (two) times daily as needed. For pain    . RANITIDINE HCL 150 MG PO TABS Oral Take 150 mg by mouth 2 (two) times daily as needed.  For indigestion      BP 145/92  Pulse 96  Resp 20  Ht 5\' 11"  (1.803 m)  Wt 220 lb (99.791 kg)  BMI 30.68 kg/m2  SpO2 100%  Physical Exam CONSTITUTIONAL: Well developed/well nourished HEAD AND FACE: Normocephalic/atraumatic EYES: EOMI/PERRL ENMT: Mucous membranes moist NECK: supple no meningeal signs SPINE:entire spine nontender CV: S1/S2 noted, no murmurs/rubs/gallops noted LUNGS: Lungs are clear to auscultation bilaterally, no apparent distress ABDOMEN: soft, nontender, no rebound or guarding; large umbilical hernia that is nontender and present for years GU:no cva tenderness NEURO: Pt is awake/alert, moves all extremitiesx4 EXTREMITIES: pulses normal, full ROM SKIN: warm, color normal PSYCH: no abnormalities of mood noted  ED Course  Procedures  DIAGNOSTIC STUDIES: Oxygen Saturation is 100% on room air, normal by my interpretation.    COORDINATION OF CARE:  12:26PM - EDMD checked EKG and stated to pt that it was normal. EDMD will order ASA, CXR, and blood workup. 2:42 PM No further pain at this time, resting comfortably  4:11 PM TROPONIN NOTED D/W DR NAHSER, CARDIOLOGY SINCE IT IS AT CUTOFF, HE REQUESTS ANOTHER TROPONIN IN TWO HOURS PT STABLE AT THIS TIME D/W DR Adriana Simas TO F/U ON REPEAT TROPONIN.  IF NORMAL AND  NOT ELEVATED CAN BE DISCHARGED HOME WITH F/U [facility] THIS WEEK  Results for orders placed during the hospital encounter of 03/29/11  CBC      Component Value Range   WBC 7.5  4.0 - 10.5 (K/uL)   RBC 4.62  4.22 - 5.81 (MIL/uL)   Hemoglobin 13.8  13.0 - 17.0 (g/dL)   HCT 16.1  09.6 - 04.5 (%)   MCV 90.3  78.0 - 100.0 (fL)   MCH 29.9  26.0 - 34.0 (pg)   MCHC 33.1  30.0 - 36.0 (g/dL)   RDW 40.9  81.1 - 91.4 (%)   Platelets 170  150 - 400 (K/uL)  DIFFERENTIAL      Component Value Range   Neutrophils Relative 60  43 - 77 (%)   Neutro Abs 4.5  1.7 - 7.7 (K/uL)   Lymphocytes Relative 23  12 - 46 (%)   Lymphs Abs 1.7  0.7 - 4.0 (K/uL)   Monocytes  Relative 12  3 - 12 (%)   Monocytes Absolute 0.9  0.1 - 1.0 (K/uL)   Eosinophils Relative 4  0 - 5 (%)   Eosinophils Absolute 0.3  0.0 - 0.7 (K/uL)   Basophils Relative 1  0 - 1 (%)   Basophils Absolute 0.1  0.0 - 0.1 (K/uL)    Dg Chest Portable 1 View  03/29/2011  *RADIOLOGY REPORT*  Clinical Data: Chest pain.  Hypertension.  Nausea and vomiting.  PORTABLE CHEST - 1 VIEW 1013 1205 hours:  Comparison: Two-view chest x-ray 01/12/2011, 01/20/2010 and portable chest x-ray 12/23/2010 [facility].  Findings: Cardiac silhouette normal and mediastinal contours unremarkable for the AP portable technique.  Lungs clear. Pulmonary vascularity normal.  Bronchovascular markings normal.  No pneumothorax.  No pleural effusions.  IMPRESSION: No acute cardiopulmonary disease.  Original Report Authenticated By: Arnell Sieving, M.D.       MDM  Nursing notes reviewed and considered in documentation xrays reviewed and considered All labs/vitals reviewed and considered Previous records reviewed and considered    Date: 03/29/2011  Rate: 89  Rhythm: normal sinus rhythm  QRS Axis: normal  Intervals: normal  ST/T Wave abnormalities: normal  Conduction Disutrbances:none  Narrative Interpretation:   Old EKG Reviewed: unchanged     I personally performed the services described in this documentation, which was scribed in my presence. The recorded information has been reviewed and considered.          Joya Gaskins, MD 03/29/11 [phone]

## 2011-03-29 NOTE — Discharge Instructions (Signed)
Chemical test associated with a heart attack was normal.  followup your primary care Dr.  Evaristo Bury always return to ER if worse.

## 2011-03-29 NOTE — ED Provider Notes (Signed)
History     CSN: [id number]  Arrival date & time 03/29/11  1132   First MD Initiated Contact with Patient 03/29/11 1210      Chief Complaint  Patient presents with  . Chest Pain  . Nausea    (Consider location/radiation/quality/duration/timing/severity/associated sxs/prior treatment) HPI  Past Medical History  Diagnosis Date  . Hypertension   . Hernia     Past Surgical History  Procedure Date  . Knee surgery     No family history on file.  History  Substance Use Topics  . Smoking status: Never Smoker   . Smokeless tobacco: Not on file  . Alcohol Use: No      Review of Systems  Allergies  Review of patient's allergies indicates no known allergies.  Home Medications   Current Outpatient Rx  Name Route Sig Dispense Refill  . LISINOPRIL 20 MG PO TABS Oral Take 20 mg by mouth daily.      Marland Kitchen NAPROXEN 500 MG PO TABS Oral Take 500 mg by mouth 2 (two) times daily as needed. For pain    . RANITIDINE HCL 150 MG PO TABS Oral Take 150 mg by mouth 2 (two) times daily as needed. For indigestion      BP 129/83  Pulse 73  Temp(Src) 97.8 F (36.6 C) (Oral)  Resp 16  Ht 5\' 11"  (1.803 m)  Wt 220 lb (99.791 kg)  BMI 30.68 kg/m2  SpO2 95%  Physical Exam  ED Course  Procedures (including critical care time)  Labs Reviewed  BASIC METABOLIC PANEL - Abnormal; Notable for the following:    Sodium 134 (*)    Glucose, Bld 120 (*)    GFR calc non Af Amer 70 (*)    GFR calc Af Amer 81 (*)    All other components within normal limits  TROPONIN I - Abnormal; Notable for the following:    Troponin I 0.30 (*)    All other components within normal limits  CBC  DIFFERENTIAL  TROPONIN I  TROPONIN I   Dg Chest Portable 1 View  03/29/2011  *RADIOLOGY REPORT*  Clinical Data: Chest pain.  Hypertension.  Nausea and vomiting.  PORTABLE CHEST - 1 VIEW 1013 1205 hours:  Comparison: Two-view chest x-ray 01/12/2011, 01/20/2010 and portable chest x-ray 12/23/2010 [facility].  Findings: Cardiac silhouette normal and mediastinal contours unremarkable for the AP portable technique.  Lungs clear. Pulmonary vascularity normal.  Bronchovascular markings normal.  No pneumothorax.  No pleural effusions.  IMPRESSION: No acute cardiopulmonary disease.  Original Report Authenticated By: Arnell Sieving, M.D.     1. Chest pain       MDM   final cardiac enzyme was less than 0.30.  Will discharge patient follow up with primary care.       Donnetta Hutching, MD 03/29/11 1940

## 2011-03-29 NOTE — ED Notes (Signed)
Pt presents with chest tightness, nausea, lt arm pain and numbness. Denies SOB. Pt states stared at 0900 today. Has a history of hypertension.

## 2011-05-24 ENCOUNTER — Encounter (HOSPITAL_COMMUNITY): Payer: Self-pay | Admitting: *Deleted

## 2011-05-24 ENCOUNTER — Emergency Department (HOSPITAL_COMMUNITY)
Admission: EM | Admit: 2011-05-24 | Discharge: 2011-05-24 | Disposition: A | Discharge status: Home / Self Care | Payer: Self-pay | Attending: Emergency Medicine | Admitting: Emergency Medicine

## 2011-05-24 ENCOUNTER — Emergency Department (HOSPITAL_COMMUNITY): Payer: Self-pay

## 2011-05-24 DIAGNOSIS — W2209XA Striking against other stationary object, initial encounter: Secondary | ICD-10-CM | POA: Insufficient documentation

## 2011-05-24 DIAGNOSIS — I10 Essential (primary) hypertension: Secondary | ICD-10-CM | POA: Insufficient documentation

## 2011-05-24 DIAGNOSIS — S63509A Unspecified sprain of unspecified wrist, initial encounter: Secondary | ICD-10-CM | POA: Insufficient documentation

## 2011-05-24 DIAGNOSIS — S63501A Unspecified sprain of right wrist, initial encounter: Secondary | ICD-10-CM

## 2011-05-24 MED ORDER — OXYCODONE-ACETAMINOPHEN 5-325 MG PO TABS
2.0000 | ORAL_TABLET | Freq: Once | ORAL | Status: AC
  Administered 2011-05-24: 2 via ORAL
  Filled 2011-05-24: qty 2

## 2011-05-24 MED ORDER — OXYCODONE-ACETAMINOPHEN 5-325 MG PO TABS: 1.0000 | ORAL_TABLET | ORAL | Status: AC | PRN

## 2011-05-24 NOTE — ED Notes (Signed)
Pt reports he got hand twisted up with an approx 100 lb lid

## 2011-05-24 NOTE — ED Provider Notes (Signed)
History   This chart was scribed for Mitchell Gaskins, MD by Charolett Bumpers . The patient was seen in room APA05/APA05.    CSN: [id number]  Arrival date & time 05/24/11  2140   First MD Initiated Contact with Patient 05/24/11 2200      Chief Complaint  Patient presents with  . Wrist Pain    HPI Mitchell Nichols is a 46 y.o. male who presents to the Emergency Department complaining of constant, moderate right wrist with associated mild swelling since earlier today. Patient states that he hit his right wrist on a 100 lb lid earlier today. Patient denies any weakness/tingling or numbness in extremities. Patient denies any fevers or vomiting. Patient denies any discoloration of right wrist. Patient reports a h/o HTN.   Pt reports the hand was not crushed or entrapped  Past Medical History  Diagnosis Date  . Hypertension   . Hernia     Past Surgical History  Procedure Date  . Knee surgery     No family history on file.  History  Substance Use Topics  . Smoking status: Never Smoker   . Smokeless tobacco: Not on file  . Alcohol Use: No      Review of Systems  Constitutional: Negative for fever.  Gastrointestinal: Negative for nausea and vomiting.  Musculoskeletal:       Right wrist pain.     Allergies  Review of patient's allergies indicates no known allergies.  Home Medications   Current Outpatient Rx  Name Route Sig Dispense Refill  . LISINOPRIL 20 MG PO TABS Oral Take 20 mg by mouth daily.      Marland Kitchen NAPROXEN 500 MG PO TABS Oral Take 500 mg by mouth 2 (two) times daily as needed. For pain    . RANITIDINE HCL 150 MG PO TABS Oral Take 150 mg by mouth 2 (two) times daily as needed. For indigestion      BP 129/93  Pulse 83  Temp(Src) 98 F (36.7 C) (Oral)  Resp 18  Ht 5\' 11"  (1.803 m)  Wt 220 lb (99.791 kg)  BMI 30.68 kg/m2  SpO2 99%  Physical Exam CONSTITUTIONAL: Well developed/well nourished HEAD AND FACE: Normocephalic/atraumatic EYES:  EOMI/PERRL ENMT: Mucous membranes moist NECK: supple no meningeal signs SPINE:entire spine nontender CV: S1/S2 noted, no murmurs/rubs/gallops noted LUNGS: Lungs are clear to auscultation bilaterally, no apparent distress ABDOMEN: soft, nontender, no rebound or guarding GU:no cva tenderness NEURO: Pt is awake/alert, moves all extremitiesx4 Distal motor/sensory intact on right hand EXTREMITIES: pulses normal, full ROM, tenderness to right wrist. No snuff box tenderness.  Minimal swelling to dorsal aspect of hand but no open skin, no bruising noted.  Distal cap refill intact less than 2 seconds SKIN: warm, color normal PSYCH: no abnormalities of mood noted  ED Course  Procedures  DIAGNOSTIC STUDIES: Oxygen Saturation is 99% on room air, normal by my interpretation.    COORDINATION OF CARE:  2224: Discussed planned course of treatment with the patient, who is agreeable at this time. Waiting on x-ray results. Will order pain medication.   2230: Medication Orders: Oxycodone-acetaminophen (Percocet) 5-325 mg per tablet 2 tablet-once.   Will place in velcro splint I doubt occult fx   Labs Reviewed - No data to display Dg Wrist Complete Right  05/24/2011  *RADIOLOGY REPORT*  Clinical Data: Wrist pain  RIGHT WRIST - COMPLETE 3+ VIEW  Comparison: None.  Findings: No evidence of distal radius or ulnar fracture. Radiocarpal joint is intact.  No carpal fracture.  IMPRESSION: No fracture.  Original Report Authenticated By: Genevive Bi, M.D.       MDM  Nursing notes reviewed and considered in documentation xrays reviewed and considered     I personally performed the services described in this documentation, which was scribed in my presence. The recorded information has been reviewed and considered.        Mitchell Gaskins, MD 05/25/11 [phone]

## 2011-05-24 NOTE — Discharge Instructions (Signed)
Joint Sprain A sprain is a tear or stretch in the ligaments that hold a joint together. Severe sprains may need as long as 3-6 weeks of immobilization and/or exercises to heal completely. Sprained joints should be rested and protected. If not, they can become unstable and prone to re-injury. Proper treatment can reduce your pain, shorten the period of disability, and reduce the risk of repeated injuries. TREATMENT   Rest and elevate the injured joint to reduce pain and swelling.   Apply ice packs to the injury for 20-30 minutes every 2-3 hours for the next 2-3 days.   Keep the injury wrapped in a compression bandage or splint as long as the joint is painful or as instructed by your caregiver.   Do not use the injured joint until it is completely healed to prevent re-injury and chronic instability. Follow the instructions of your caregiver.   Long-term sprain management may require exercises and/or treatment by a physical therapist. Taping or special braces may help stabilize the joint until it is completely better.  SEEK MEDICAL CARE IF:   You develop increased pain or swelling of the joint.   You develop increasing redness and warmth of the joint.   You develop a fever.   It becomes stiff.   Your hand or foot gets cold or numb.  Document Released: 01/30/2004 Document Revised: 12/11/2010 Document Reviewed: 01/09/2008 [employer] Patient Information 2012 [employer]. 

## 2011-12-28 ENCOUNTER — Emergency Department (HOSPITAL_COMMUNITY)
Admission: EM | Admit: 2011-12-28 | Discharge: 2011-12-28 | Disposition: A | Discharge status: Home / Self Care | Payer: Self-pay | Attending: Emergency Medicine | Admitting: Emergency Medicine

## 2011-12-28 ENCOUNTER — Encounter (HOSPITAL_COMMUNITY): Payer: Self-pay | Admitting: *Deleted

## 2011-12-28 ENCOUNTER — Emergency Department (HOSPITAL_COMMUNITY): Payer: Self-pay

## 2011-12-28 DIAGNOSIS — K469 Unspecified abdominal hernia without obstruction or gangrene: Secondary | ICD-10-CM | POA: Insufficient documentation

## 2011-12-28 DIAGNOSIS — Z79899 Other long term (current) drug therapy: Secondary | ICD-10-CM | POA: Insufficient documentation

## 2011-12-28 DIAGNOSIS — R42 Dizziness and giddiness: Secondary | ICD-10-CM | POA: Insufficient documentation

## 2011-12-28 DIAGNOSIS — I10 Essential (primary) hypertension: Secondary | ICD-10-CM | POA: Insufficient documentation

## 2011-12-28 DIAGNOSIS — Z7982 Long term (current) use of aspirin: Secondary | ICD-10-CM | POA: Insufficient documentation

## 2011-12-28 LAB — BASIC METABOLIC PANEL
BUN: 22 mg/dL (ref 6–23)
Calcium: 9 mg/dL (ref 8.4–10.5)
Creatinine, Ser: 1.25 mg/dL (ref 0.50–1.35)
GFR calc Af Amer: 78 mL/min — ABNORMAL LOW (ref >90)
GFR calc non Af Amer: 67 mL/min — ABNORMAL LOW (ref >90)
Potassium: 4.2 mEq/L (ref 3.5–5.1)

## 2011-12-28 LAB — CBC WITH DIFFERENTIAL/PLATELET
Basophils Relative: 1 % (ref 0–1)
Eosinophils Absolute: 0.6 10*3/uL (ref 0.0–0.7)
Eosinophils Relative: 6 % — ABNORMAL HIGH (ref 0–5)
Hemoglobin: 14.5 g/dL (ref 13.0–17.0)
MCH: 30.9 pg (ref 26.0–34.0)
MCHC: 34 g/dL (ref 30.0–36.0)
Monocytes Absolute: 1.1 10*3/uL — ABNORMAL HIGH (ref 0.1–1.0)
Monocytes Relative: 11 % (ref 3–12)
Neutrophils Relative %: 57 % (ref 43–77)

## 2011-12-28 LAB — URINALYSIS, ROUTINE W REFLEX MICROSCOPIC
Bilirubin Urine: NEGATIVE
Ketones, ur: NEGATIVE mg/dL
Protein, ur: NEGATIVE mg/dL
Urobilinogen, UA: 0.2 mg/dL (ref 0.0–1.0)

## 2011-12-28 LAB — URINE MICROSCOPIC-ADD ON

## 2011-12-28 NOTE — ED Notes (Addendum)
Felt "light headed and dizzy", checked his bp -was 159/99- p-11- cbg was 134  Still feels shakey  Had pain lt arm pta,none now

## 2011-12-28 NOTE — ED Provider Notes (Signed)
History     CSN: [id number]  Arrival date & time 12/28/11  Ernestina Columbia   First MD Initiated Contact with Patient 12/28/11 2036      Chief Complaint  Patient presents with  . Hypertension    (Consider location/radiation/quality/duration/timing/severity/associated sxs/prior treatment) HPI Comments: Mitchell Nichols is a 46 y.o. Male who was in the woods, sitting, hunting, when he had sudden onset of dizziness and a flushed feeling. He felt like his blood pressure was high. He drove home, where his blood pressure was checked and was elevated at 155/99. He had some mild chest discomfort when feeling dizzy. The dizziness and the chest discomfort has improved. He has mild, residual sensation of nausea. He is not dizzy, currently. He took his usual medications tonight. He has not eaten since about noon today.there are no known modifying factors. His father died of a heart attack at age 102. He does not have elevated cholesterol. He does not smoke.  The history is provided by the patient.    Past Medical History  Diagnosis Date  . Hypertension   . Hernia     Past Surgical History  Procedure Date  . Knee surgery     History reviewed. No pertinent family history.  History  Substance Use Topics  . Smoking status: Never Smoker   . Smokeless tobacco: Not on file  . Alcohol Use: No      Review of Systems  All other systems reviewed and are negative.    Allergies  Review of patient's allergies indicates no known allergies.  Home Medications   Current Outpatient Rx  Name  Route  Sig  Dispense  Refill  . ASPIRIN EC 81 MG PO TBEC   Oral   Take 81 mg by mouth daily.         Marland Kitchen LISINOPRIL 20 MG PO TABS   Oral   Take 20 mg by mouth daily.           Marland Kitchen RANITIDINE HCL 150 MG PO TABS   Oral   Take 150 mg by mouth 2 (two) times daily as needed. For indigestion           BP 129/89  Pulse 67  Temp 98.9 F (37.2 C) (Oral)  Resp 16  Ht 5' 11.5" (1.816 m)  Wt 236 lb (107.049  kg)  BMI 32.46 kg/m2  SpO2 95%  Physical Exam  Nursing note and vitals reviewed. Constitutional: He is oriented to person, place, and time. He appears well-developed and well-nourished.  HENT:  Head: Normocephalic and atraumatic.  Right Ear: External ear normal.  Left Ear: External ear normal.  Eyes: Conjunctivae normal and EOM are normal. Pupils are equal, round, and reactive to light.  Neck: Normal range of motion and phonation normal. Neck supple.  Cardiovascular: Normal rate, regular rhythm, normal heart sounds and intact distal pulses.   Pulmonary/Chest: Effort normal and breath sounds normal. He exhibits no bony tenderness.  Abdominal: Soft. Normal appearance. There is no tenderness.       Reducible midline abdominal wall hernia. It is nontender to palpation  Musculoskeletal: Normal range of motion.  Neurological: He is alert and oriented to person, place, and time. He has normal strength. No cranial nerve deficit or sensory deficit. He exhibits normal muscle tone. Coordination normal.  Skin: Skin is warm, dry and intact.  Psychiatric: His behavior is normal. Judgment and thought content normal.       He appears anxious    ED Course  Procedures (including  critical care time)  Emergency department treatment IV fluid bolus, and drip     Date: 10/23/2011  Rate: 91  Rhythm: normal sinus rhythm  QRS Axis: normal  PR and QT Intervals: normal  ST/T Wave abnormalities: normal  PR and QRS Conduction Disutrbances:none  Narrative Interpretation:   Old EKG Reviewed: unchanged     Labs Reviewed  CBC WITH DIFFERENTIAL - Abnormal; Notable for the following:    WBC 10.6 (*)     Monocytes Absolute 1.1 (*)     Eosinophils Relative 6 (*)     All other components within normal limits  BASIC METABOLIC PANEL - Abnormal; Notable for the following:    Glucose, Bld 109 (*)     GFR calc non Af Amer 67 (*)     GFR calc Af Amer 78 (*)     All other components within normal limits   URINALYSIS, ROUTINE W REFLEX MICROSCOPIC - Abnormal; Notable for the following:    Hgb urine dipstick TRACE (*)     All other components within normal limits  TROPONIN I  URINE MICROSCOPIC-ADD ON  LAB REPORT - SCANNED   Nursing notes, applicable records and vitals reviewed.  Radiologic Images/Reports reviewed.    1. Dizziness       MDM  Nonspecific dizziness. Possible mild dehydration. Improved after treatment in the emergency department. Doubt ACS, PE, pneumonia. Doubt metabolic instability, serious bacterial infection or impending vascular cllapse; the patient is stable for discharge.   Plan: Home Medications- Usual; Treatments- rest; Recommended follow up- PCP prn       Flint Melter, MD 12/31/11 1542

## 2012-06-08 ENCOUNTER — Ambulatory Visit (INDEPENDENT_AMBULATORY_CARE_PROVIDER_SITE_OTHER): Payer: Self-pay | Admitting: Surgery

## 2012-06-27 ENCOUNTER — Encounter (INDEPENDENT_AMBULATORY_CARE_PROVIDER_SITE_OTHER): Payer: Self-pay | Admitting: Surgery

## 2013-09-03 ENCOUNTER — Emergency Department (HOSPITAL_COMMUNITY): Payer: PRIVATE HEALTH INSURANCE

## 2013-09-03 ENCOUNTER — Emergency Department (HOSPITAL_COMMUNITY)
Admission: EM | Admit: 2013-09-03 | Discharge: 2013-09-03 | Disposition: A | Discharge status: Home / Self Care | Payer: PRIVATE HEALTH INSURANCE | Attending: Emergency Medicine | Admitting: Emergency Medicine

## 2013-09-03 ENCOUNTER — Encounter (HOSPITAL_COMMUNITY): Payer: Self-pay | Admitting: Emergency Medicine

## 2013-09-03 DIAGNOSIS — R109 Unspecified abdominal pain: Secondary | ICD-10-CM | POA: Insufficient documentation

## 2013-09-03 DIAGNOSIS — N289 Disorder of kidney and ureter, unspecified: Secondary | ICD-10-CM | POA: Diagnosis not present

## 2013-09-03 DIAGNOSIS — I10 Essential (primary) hypertension: Secondary | ICD-10-CM | POA: Insufficient documentation

## 2013-09-03 DIAGNOSIS — Z79899 Other long term (current) drug therapy: Secondary | ICD-10-CM | POA: Diagnosis not present

## 2013-09-03 DIAGNOSIS — Z7982 Long term (current) use of aspirin: Secondary | ICD-10-CM | POA: Diagnosis not present

## 2013-09-03 DIAGNOSIS — Z87442 Personal history of urinary calculi: Secondary | ICD-10-CM | POA: Diagnosis not present

## 2013-09-03 DIAGNOSIS — N201 Calculus of ureter: Secondary | ICD-10-CM | POA: Insufficient documentation

## 2013-09-03 DIAGNOSIS — N23 Unspecified renal colic: Secondary | ICD-10-CM

## 2013-09-03 HISTORY — DX: Calculus of kidney: N20.0

## 2013-09-03 LAB — CBC WITH DIFFERENTIAL/PLATELET
BASOS ABS: 0 10*3/uL (ref 0.0–0.1)
BASOS PCT: 0 % (ref 0–1)
EOS ABS: 0.3 10*3/uL (ref 0.0–0.7)
Eosinophils Relative: 3 % (ref 0–5)
HCT: 39.4 % (ref 39.0–52.0)
HEMOGLOBIN: 13.2 g/dL (ref 13.0–17.0)
Lymphocytes Relative: 26 % (ref 12–46)
Lymphs Abs: 3.1 10*3/uL (ref 0.7–4.0)
MCH: 31.8 pg (ref 26.0–34.0)
MCHC: 33.5 g/dL (ref 30.0–36.0)
MCV: 94.9 fL (ref 78.0–100.0)
MONOS PCT: 9 % (ref 3–12)
Monocytes Absolute: 1.1 10*3/uL — ABNORMAL HIGH (ref 0.1–1.0)
NEUTROS PCT: 62 % (ref 43–77)
Neutro Abs: 7.7 10*3/uL (ref 1.7–7.7)
PLATELETS: 180 10*3/uL (ref 150–400)
RBC: 4.15 MIL/uL — ABNORMAL LOW (ref 4.22–5.81)
RDW: 13.4 % (ref 11.5–15.5)
WBC: 12.2 10*3/uL — ABNORMAL HIGH (ref 4.0–10.5)

## 2013-09-03 LAB — URINALYSIS, ROUTINE W REFLEX MICROSCOPIC
Bilirubin Urine: NEGATIVE
Glucose, UA: NEGATIVE mg/dL
Ketones, ur: NEGATIVE mg/dL
LEUKOCYTES UA: NEGATIVE
Nitrite: NEGATIVE
Protein, ur: NEGATIVE mg/dL
Specific Gravity, Urine: 1.025 (ref 1.005–1.030)
Urobilinogen, UA: 0.2 mg/dL (ref 0.0–1.0)
pH: 5 (ref 5.0–8.0)

## 2013-09-03 LAB — BASIC METABOLIC PANEL
ANION GAP: 14 (ref 5–15)
BUN: 27 mg/dL — AB (ref 6–23)
CO2: 24 mEq/L (ref 19–32)
Calcium: 9.2 mg/dL (ref 8.4–10.5)
Chloride: 100 mEq/L (ref 96–112)
Creatinine, Ser: 1.99 mg/dL — ABNORMAL HIGH (ref 0.50–1.35)
GFR, EST AFRICAN AMERICAN: 44 mL/min — AB (ref >90)
GFR, EST NON AFRICAN AMERICAN: 38 mL/min — AB (ref >90)
Glucose, Bld: 108 mg/dL — ABNORMAL HIGH (ref 70–99)
POTASSIUM: 4 meq/L (ref 3.7–5.3)
Sodium: 138 mEq/L (ref 137–147)

## 2013-09-03 LAB — URINE MICROSCOPIC-ADD ON

## 2013-09-03 MED ORDER — KETOROLAC TROMETHAMINE 30 MG/ML IJ SOLN
30.0000 mg | Freq: Once | INTRAMUSCULAR | Status: AC
  Administered 2013-09-03: 30 mg via INTRAVENOUS
  Filled 2013-09-03: qty 1

## 2013-09-03 MED ORDER — SODIUM CHLORIDE 0.9 % IV BOLUS (SEPSIS)
500.0000 mL | Freq: Once | INTRAVENOUS | Status: AC
  Administered 2013-09-03: 500 mL via INTRAVENOUS

## 2013-09-03 MED ORDER — OXYCODONE-ACETAMINOPHEN 5-325 MG PO TABS: 1.0000 | ORAL_TABLET | ORAL | Status: DC | PRN

## 2013-09-03 MED ORDER — HYDROMORPHONE HCL PF 1 MG/ML IJ SOLN
1.0000 mg | Freq: Once | INTRAMUSCULAR | Status: AC
  Administered 2013-09-03: 1 mg via INTRAVENOUS
  Filled 2013-09-03: qty 1

## 2013-09-03 MED ORDER — ONDANSETRON HCL 4 MG/2ML IJ SOLN
4.0000 mg | Freq: Once | INTRAMUSCULAR | Status: AC
  Administered 2013-09-03: 4 mg via INTRAVENOUS
  Filled 2013-09-03: qty 2

## 2013-09-03 MED ORDER — ONDANSETRON HCL 8 MG PO TABS: 8.0000 mg | ORAL_TABLET | Freq: Three times a day (TID) | ORAL | Status: DC | PRN

## 2013-09-03 MED ORDER — SODIUM CHLORIDE 0.9 % IV SOLN: Freq: Once | INTRAVENOUS | Status: DC

## 2013-09-03 NOTE — ED Notes (Signed)
Pt c/o severe pain to right flank; pt has hx of kidney stones

## 2013-09-03 NOTE — ED Notes (Signed)
Dr. Wickline at bedside.  

## 2013-09-03 NOTE — ED Provider Notes (Signed)
CSN: [id number]     Arrival date & time 09/03/13  2115 History  This chart was scribed for Joya Gaskins, MD by Roxy Cedar, ED Scribe. This patient was seen in room [facility] and the patient's care was started at 10:14 PM.   Chief Complaint  Patient presents with  . Flank Pain   Patient is a 48 y.o. male presenting with flank pain. The history is provided by the patient. No language interpreter was used.  Flank Pain This is a new problem. The current episode started 12 to 24 hours ago. The problem occurs constantly. The problem has been gradually worsening. Pertinent negatives include no chest pain, no abdominal pain and no shortness of breath. Nothing aggravates the symptoms. Nothing relieves the symptoms. He has tried nothing for the symptoms.   HPI Comments: Rachard Isidro is a 48 y.o. male with a history of kidney stones, who presents to the Emergency Department complaining of gradually worsening right sided flank pain that began this morning while he was laying in bed. He denies radiation to any other part of his body. Patient denies associated fever, vomiting, or shortness of breath. Patient states that his current pain is similar to how he felt when he had a kidney stone in the 1990's.   Past Medical History  Diagnosis Date  . Hypertension   . Hernia   . Kidney stones    Past Surgical History  Procedure Laterality Date  . Knee surgery    . Hernia repair    . Hand surgery Left    History reviewed. No pertinent family history. History  Substance Use Topics  . Smoking status: Never Smoker   . Smokeless tobacco: Not on file  . Alcohol Use: No    Review of Systems  Respiratory: Negative for shortness of breath.   Cardiovascular: Negative for chest pain.  Gastrointestinal: Negative for abdominal pain.  Genitourinary: Positive for hematuria and flank pain.  All other systems reviewed and are negative.  Allergies  Review of patient's allergies indicates no known  allergies.  Home Medications   Prior to Admission medications   Medication Sig Start Date End Date Taking? Authorizing Provider  lisinopril-hydrochlorothiazide (PRINZIDE,ZESTORETIC) 20-25 MG per tablet Take 1 tablet by mouth daily.   Yes Historical Provider, MD  aspirin EC 81 MG tablet Take 81 mg by mouth daily.    Historical Provider, MD  lisinopril (PRINIVIL,ZESTRIL) 20 MG tablet Take 20 mg by mouth daily.      Historical Provider, MD  ranitidine (ZANTAC) 150 MG tablet Take 150 mg by mouth 2 (two) times daily as needed. For indigestion    Historical Provider, MD   Triage Vitals: BP 137/97  Pulse 97  Temp(Src) 98.1 F (36.7 C)  Resp 20  Ht 5' 11.5" (1.816 m)  Wt 260 lb (117.935 kg)  BMI 35.76 kg/m2  SpO2 96%  Physical Exam  Nursing note and vitals reviewed.  CONSTITUTIONAL: Well developed/well nourished HEAD: Normocephalic/atraumatic EYES: EOMI/PERRL ENMT: Mucous membranes moist NECK: supple no meningeal signs SPINE:entire spine nontender CV: S1/S2 noted, no murmurs/rubs/gallops noted LUNGS: Lungs are clear to auscultation bilaterally, no apparent distress ABDOMEN: soft, nontender, no rebound or guarding WU:JWJXB cva tenderness NEURO: Pt is awake/alert, moves all extremitiesx4 EXTREMITIES: pulses normal, full ROM SKIN: warm, color normal PSYCH: no abnormalities of mood noted  ED Course  Procedures   DIAGNOSTIC STUDIES: Oxygen Saturation is 96% on RA, normal by my interpretation.    COORDINATION OF CARE: 10:17 PM- Discussed plans to order  diagnostic CT imaging. Pt advised of plan for treatment and pt agrees. 11:26 PM Pt feels much improved He is taking PO We discussed CT findings He was also noted to have mild renal insufficieny but he has had this before Advised to increase fluid intake and to f/u with PCP in one week for lab recheck Pt agreeable with plan Labs Review Labs Reviewed  CBC WITH DIFFERENTIAL - Abnormal; Notable for the following:    WBC 12.2 (*)     RBC 4.15 (*)    Monocytes Absolute 1.1 (*)    All other components within normal limits  BASIC METABOLIC PANEL - Abnormal; Notable for the following:    Glucose, Bld 108 (*)    BUN 27 (*)    Creatinine, Ser 1.99 (*)    GFR calc non Af Amer 38 (*)    GFR calc Af Amer 44 (*)    All other components within normal limits  URINALYSIS, ROUTINE W REFLEX MICROSCOPIC - Abnormal; Notable for the following:    Hgb urine dipstick LARGE (*)    All other components within normal limits  URINE MICROSCOPIC-ADD ON - Abnormal; Notable for the following:    Casts HYALINE CASTS (*)    All other components within normal limits    Imaging Review Ct Abdomen Pelvis Wo Contrast  09/03/2013   CLINICAL DATA:  Right flank pain.  EXAM: CT ABDOMEN AND PELVIS WITHOUT CONTRAST  TECHNIQUE: Multidetector CT imaging of the abdomen and pelvis was performed following the standard protocol without IV contrast.  COMPARISON:  None.  FINDINGS: Lung bases are clear.  Normal heart size.  Organ evaluation is limited in the absence of intravenous contrast. Within this limitation; hepatic steatosis. No radiodense gallstones or biliary ductal dilatation.  No appreciable abnormality of the spleen, pancreas, adrenal glands.  Lobular renal contours. Tiny nonobstructing lower pole/interpolar left renal stone. Lower pole hypodensity left kidney anteriorly, favored to reflect a cyst.  Right renal edema and perinephric fat stranding. Mild right hydroureteronephrosis and mild periureteral fat stranding to the level of a 2 mm distal right ureteral stone.  No overt colitis. Normal appendix. Small bowel loops are of normal course and caliber. No free intraperitoneal air or fluid. No lymphadenopathy. Normal caliber aorta and branch vessels.  Decompressed bladder. Small fat containing left inguinal hernia. Normal size prostate gland.  No acute osseous finding.  L3 vertebral body hemangioma.  IMPRESSION: Mild right hydroureteronephrosis to the level of  a distal ureteral 2 mm stone.  Tiny nonobstructing left renal stone.  Hepatic steatosis.   Electronically Signed   By: Jearld Lesch M.D.   On: 09/03/2013 22:59    MDM   Final diagnoses:  Right ureteral stone  Ureteral colic  Renal insufficiency    Nursing notes including past medical history and social history reviewed and considered in documentation Labs/vital reviewed and considered Previous labs reviewed  I personally performed the services described in this documentation, which was scribed in my presence. The recorded information has been reviewed and is accurate.      Joya Gaskins, MD 09/03/13 [phone]

## 2016-06-03 ENCOUNTER — Emergency Department (HOSPITAL_COMMUNITY): Payer: Self-pay

## 2016-06-03 ENCOUNTER — Emergency Department (HOSPITAL_COMMUNITY)
Admission: EM | Admit: 2016-06-03 | Discharge: 2016-06-04 | Disposition: A | Discharge status: Home / Self Care | Payer: Self-pay | Attending: Emergency Medicine | Admitting: Emergency Medicine

## 2016-06-03 ENCOUNTER — Encounter (HOSPITAL_COMMUNITY): Payer: Self-pay | Admitting: *Deleted

## 2016-06-03 DIAGNOSIS — R51 Headache: Secondary | ICD-10-CM | POA: Insufficient documentation

## 2016-06-03 DIAGNOSIS — I10 Essential (primary) hypertension: Secondary | ICD-10-CM | POA: Insufficient documentation

## 2016-06-03 DIAGNOSIS — R110 Nausea: Secondary | ICD-10-CM | POA: Insufficient documentation

## 2016-06-03 DIAGNOSIS — Z79899 Other long term (current) drug therapy: Secondary | ICD-10-CM | POA: Insufficient documentation

## 2016-06-03 DIAGNOSIS — R197 Diarrhea, unspecified: Secondary | ICD-10-CM | POA: Insufficient documentation

## 2016-06-03 DIAGNOSIS — J069 Acute upper respiratory infection, unspecified: Secondary | ICD-10-CM | POA: Insufficient documentation

## 2016-06-03 DIAGNOSIS — R21 Rash and other nonspecific skin eruption: Secondary | ICD-10-CM | POA: Insufficient documentation

## 2016-06-03 NOTE — ED Provider Notes (Signed)
[facility]-EMERGENCY DEPT Provider Note   CSN: [id number] Arrival date & time: 06/03/16  2121     History   Chief Complaint Chief Complaint  Patient presents with  . Fever    HPI Mitchell Nichols is a 51 y.o. male.  The history is provided by the patient.  Fever   Associated symptoms include diarrhea, headaches and cough. Pertinent negatives include no vomiting, no congestion and no sore throat.  URI   This is a new problem. The current episode started more than 2 days ago. The problem has been gradually worsening. The maximum temperature recorded prior to his arrival was 102 to 102.9 F. Associated symptoms include diarrhea, nausea, headaches, cough and rash. Pertinent negatives include no vomiting, no congestion, no rhinorrhea, no sneezing and no sore throat. He has tried NSAIDs for the symptoms. The treatment provided no relief.    Past Medical History:  Diagnosis Date  . Hernia   . Hypertension   . Kidney stones     There are no active problems to display for this patient.   Past Surgical History:  Procedure Laterality Date  . HAND SURGERY Left   . HERNIA REPAIR    . KNEE SURGERY         Home Medications    Prior to Admission medications   Medication Sig Start Date End Date Taking? Authorizing Provider  lisinopril-hydrochlorothiazide (PRINZIDE,ZESTORETIC) 20-25 MG per tablet Take 1 tablet by mouth daily.    [provider]  ondansetron (ZOFRAN) 8 MG tablet Take 1 tablet (8 mg total) by mouth every 8 (eight) hours as needed. 09/03/13   Zadie RhineWickline, Donald, MD  oxyCODONE-acetaminophen (PERCOCET/ROXICET) 5-325 MG per tablet Take 1 tablet by mouth every 4 (four) hours as needed for severe pain. 09/03/13   Zadie RhineWickline, Donald, MD  ranitidine (ZANTAC) 150 MG tablet Take 150 mg by mouth 2 (two) times daily as needed. For indigestion    [provider]    Family History History reviewed. No pertinent family history.  Social History Social History  Substance  Use Topics  . Smoking status: Never Smoker  . Smokeless tobacco: Never Used  . Alcohol use No     Allergies   Patient has no known allergies.   Review of Systems Review of Systems  Constitutional: Positive for fever.  HENT: Negative for congestion, rhinorrhea, sneezing and sore throat.   Respiratory: Positive for cough.   Gastrointestinal: Positive for diarrhea and nausea. Negative for vomiting.  Skin: Positive for rash.  Neurological: Positive for headaches.  All other systems reviewed and are negative.    Physical Exam Updated Vital Signs BP 113/68   Pulse (!) 101   Temp 99 F (37.2 C) (Oral)   Resp 18   Ht 5\' 9"  (1.753 m)   Wt 108.9 kg (240 lb)   SpO2 96%   BMI 35.44 kg/m   Physical Exam  Constitutional: He is oriented to person, place, and time. He appears well-developed and well-nourished.  Non-toxic appearance.  HENT:  Head: Normocephalic.  Right Ear: Tympanic membrane and external ear normal.  Left Ear: Tympanic membrane and external ear normal.  Mild to mod increase redness of the posterior pharynx.  Eyes: EOM and lids are normal. Pupils are equal, round, and reactive to light.  Neck: Normal range of motion. Neck supple. Carotid bruit is not present.  Cardiovascular: Normal rate, regular rhythm, normal heart sounds, intact distal pulses and normal pulses.   Pulmonary/Chest: Breath sounds normal. No respiratory distress.  Abdominal: Soft.  Bowel sounds are normal. There is no tenderness. There is no guarding.  Musculoskeletal: Normal range of motion.  Lymphadenopathy:       Head (right side): No submandibular adenopathy present.       Head (left side): No submandibular adenopathy present.    He has no cervical adenopathy.  Neurological: He is alert and oriented to person, place, and time. He has normal strength. No cranial nerve deficit or sensory deficit.  Skin: Skin is warm and dry.  Psychiatric: He has a normal mood and affect. His speech is normal.    Nursing note and vitals reviewed.    ED Treatments / Results  Labs (all labs ordered are listed, but only abnormal results are displayed) Labs Reviewed - No data to display  EKG  EKG Interpretation None       Radiology Dg Chest 2 View  Result Date: 06/03/2016 CLINICAL DATA:  Fever with nonproductive cough, chills and body aches since Sunday. EXAM: CHEST  2 VIEW COMPARISON:  None. FINDINGS: The heart size and mediastinal contours are within normal limits. Both lungs are clear. The visualized skeletal structures are unremarkable. IMPRESSION: No active cardiopulmonary disease. No significant change from prior. Electronically Signed   By: Tollie Eth M.D.   On: 06/03/2016 22:10    Procedures Procedures (including critical care time)  Medications Ordered in ED Medications - No data to display   Initial Impression / Assessment and Plan / ED Course  I have reviewed the triage vital signs and the nursing notes.  Pertinent labs & imaging results that were available during my care of the patient were reviewed by me and considered in my medical decision making (see chart for details).      Final Clinical Impressions(s) / ED Diagnoses MDM Patient heart rate is slightly above the normal at 101. The pulse oximetry is 96% on room air. Within normal limits by my interpretation. I reviewed the chest x-ray. No acute findings noted at this time. I suspect the patient has an upper respiratory infection. Will use Zithromax in the event of atypical organism. Patient will increase fluids. Use Tylenol and ibuprofen for fever and aching. We discussed the importance of good handwashing and good hydration. Patient acknowledges understanding of the instructions.    Final diagnoses:  Upper respiratory tract infection, unspecified type    New Prescriptions New Prescriptions   No medications on file     Ivery Quale, Cordelia Poche 06/04/16 0017    Zadie Rhine, MD 06/04/16 [phone]

## 2016-06-03 NOTE — ED Triage Notes (Signed)
Pt reports fever, cough, chills, and body aches since Sunday.

## 2016-06-04 MED ORDER — IBUPROFEN 800 MG PO TABS
800.0000 mg | ORAL_TABLET | Freq: Once | ORAL | Status: AC
  Administered 2016-06-04: 800 mg via ORAL
  Filled 2016-06-04: qty 1

## 2016-06-04 MED ORDER — AZITHROMYCIN 250 MG PO TABS: ORAL_TABLET | ORAL | 0 refills | Status: DC

## 2016-06-04 MED ORDER — ONDANSETRON HCL 4 MG PO TABS
4.0000 mg | ORAL_TABLET | Freq: Once | ORAL | Status: AC
  Administered 2016-06-04: 4 mg via ORAL
  Filled 2016-06-04: qty 1

## 2016-06-04 MED ORDER — AZITHROMYCIN 250 MG PO TABS
500.0000 mg | ORAL_TABLET | Freq: Once | ORAL | Status: AC
  Administered 2016-06-04: 500 mg via ORAL
  Filled 2016-06-04: qty 2

## 2016-06-04 NOTE — ED Notes (Signed)
Pt c/o cough, chills, body aches, sweating at times, diarrhea for the past few days, denies any n/v

## 2016-06-04 NOTE — Discharge Instructions (Signed)
Your chest x-ray is negative for acute problem. Your oxygen level is 96% on room air. Your examination suggest an upper respiratory infection. Please use Tylenol and ibuprofen with rectus, lunch, dinner, and at bedtime. Please increase fluids. Use Zithromax daily until all taken. Please see Dr.Nyland for follow-up if not improving.

## 2019-03-27 ENCOUNTER — Emergency Department (HOSPITAL_COMMUNITY): Payer: Self-pay

## 2019-03-27 ENCOUNTER — Other Ambulatory Visit: Payer: Self-pay

## 2019-03-27 ENCOUNTER — Emergency Department (HOSPITAL_COMMUNITY)
Admission: EM | Admit: 2019-03-27 | Discharge: 2019-03-27 | Disposition: A | Discharge status: Home / Self Care | Payer: Self-pay | Attending: Emergency Medicine | Admitting: Emergency Medicine

## 2019-03-27 ENCOUNTER — Encounter (HOSPITAL_COMMUNITY): Payer: Self-pay | Admitting: *Deleted

## 2019-03-27 DIAGNOSIS — I10 Essential (primary) hypertension: Secondary | ICD-10-CM | POA: Insufficient documentation

## 2019-03-27 DIAGNOSIS — R519 Headache, unspecified: Secondary | ICD-10-CM | POA: Insufficient documentation

## 2019-03-27 DIAGNOSIS — Z79899 Other long term (current) drug therapy: Secondary | ICD-10-CM | POA: Insufficient documentation

## 2019-03-27 LAB — BASIC METABOLIC PANEL
Anion gap: 8 (ref 5–15)
BUN: 26 mg/dL — ABNORMAL HIGH (ref 6–20)
CO2: 30 mmol/L (ref 22–32)
Calcium: 9.1 mg/dL (ref 8.9–10.3)
Chloride: 101 mmol/L (ref 98–111)
Creatinine, Ser: 1.34 mg/dL — ABNORMAL HIGH (ref 0.61–1.24)
GFR calc Af Amer: >60 mL/min (ref >60)
GFR calc non Af Amer: 60 mL/min — ABNORMAL LOW (ref >60)
Glucose, Bld: 120 mg/dL — ABNORMAL HIGH (ref 70–99)
Potassium: 3.8 mmol/L (ref 3.5–5.1)
Sodium: 139 mmol/L (ref 135–145)

## 2019-03-27 LAB — CBC WITH DIFFERENTIAL/PLATELET
Abs Immature Granulocytes: 0.07 10*3/uL (ref 0.00–0.07)
Basophils Absolute: 0.1 10*3/uL (ref 0.0–0.1)
Basophils Relative: 1 %
Eosinophils Absolute: 0.6 10*3/uL — ABNORMAL HIGH (ref 0.0–0.5)
Eosinophils Relative: 7 %
HCT: 46.1 % (ref 39.0–52.0)
Hemoglobin: 14.6 g/dL (ref 13.0–17.0)
Immature Granulocytes: 1 %
Lymphocytes Relative: 23 %
Lymphs Abs: 2.2 10*3/uL (ref 0.7–4.0)
MCH: 31.3 pg (ref 26.0–34.0)
MCHC: 31.7 g/dL (ref 30.0–36.0)
MCV: 98.7 fL (ref 80.0–100.0)
Monocytes Absolute: 1.1 10*3/uL — ABNORMAL HIGH (ref 0.1–1.0)
Monocytes Relative: 11 %
Neutro Abs: 5.6 10*3/uL (ref 1.7–7.7)
Neutrophils Relative %: 57 %
Platelets: 163 10*3/uL (ref 150–400)
RBC: 4.67 MIL/uL (ref 4.22–5.81)
RDW: 12.7 % (ref 11.5–15.5)
WBC: 9.6 10*3/uL (ref 4.0–10.5)
nRBC: 0 % (ref 0.0–0.2)

## 2019-03-27 MED ORDER — DIPHENHYDRAMINE HCL 50 MG/ML IJ SOLN
25.0000 mg | Freq: Once | INTRAMUSCULAR | Status: AC
  Administered 2019-03-27: 03:00:00 25 mg via INTRAMUSCULAR
  Filled 2019-03-27: qty 1

## 2019-03-27 MED ORDER — LISINOPRIL-HYDROCHLOROTHIAZIDE 20-25 MG PO TABS: 1.0000 | ORAL_TABLET | Freq: Every day | ORAL | 0 refills | Status: DC

## 2019-03-27 MED ORDER — METOCLOPRAMIDE HCL 5 MG/ML IJ SOLN
10.0000 mg | Freq: Once | INTRAMUSCULAR | Status: AC
  Administered 2019-03-27: 10 mg via INTRAMUSCULAR
  Filled 2019-03-27: qty 2

## 2019-03-27 MED ORDER — KETOROLAC TROMETHAMINE 30 MG/ML IJ SOLN
30.0000 mg | Freq: Once | INTRAMUSCULAR | Status: AC
  Administered 2019-03-27: 30 mg via INTRAMUSCULAR
  Filled 2019-03-27: qty 1

## 2019-03-27 MED ORDER — LISINOPRIL 10 MG PO TABS
20.0000 mg | ORAL_TABLET | Freq: Once | ORAL | Status: AC
  Administered 2019-03-27: 03:00:00 20 mg via ORAL
  Filled 2019-03-27: qty 2

## 2019-03-27 MED ORDER — HYDROCHLOROTHIAZIDE 25 MG PO TABS
25.0000 mg | ORAL_TABLET | Freq: Every day | ORAL | Status: DC
  Administered 2019-03-27: 03:00:00 25 mg via ORAL
  Filled 2019-03-27: qty 1

## 2019-03-27 NOTE — ED Provider Notes (Signed)
[facility] EMERGENCY DEPARTMENT Provider Note   CSN: [id number] Arrival date & time: 03/27/19  0115     History Chief Complaint  Patient presents with  . Hypertension    Mitchell Nichols is a 54 y.o. male.  Patient with history of hypertension presenting with headache as well as elevated blood pressure tonight.  States he developed a right-sided headache that onset suddenly while he was watching TV about 11 PM.  Reports pain in the right side of his head that came on suddenly.  He became concerned and checked his blood pressure at home and found it to be 210/117.  He does have a history of hypertension but is not had his medications for 4 months after his doctor retired.  He states he has been having "nasty headaches" for about the past 2 weeks that he wakes up with in the morning and then go away during the day.  This headache he is having tonight is similar but he had never had it at night before and it is more severe.  He denies any focal weakness, numbness, tingling.  No difficulty speaking or swallowing.  No chest pain or shortness of breath.  No fever or vomiting.  No photophobia or phonophobia. No blurry vision or double vision. Has noticed an increase swelling in his legs as well as not having his water pill for 4 months as well.  The history is provided by the patient.  Hypertension Associated symptoms include headaches. Pertinent negatives include no chest pain, no abdominal pain and no shortness of breath.       Past Medical History:  Diagnosis Date  . Hernia   . Hypertension   . Kidney stones     There are no problems to display for this patient.   Past Surgical History:  Procedure Laterality Date  . HAND SURGERY Left   . HERNIA REPAIR    . KNEE SURGERY         No family history on file.  Social History   Tobacco Use  . Smoking status: Never Smoker  . Smokeless tobacco: Never Used  Substance Use Topics  . Alcohol use: No  . Drug use: No    Home  Medications Prior to Admission medications   Medication Sig Start Date End Date Taking? Authorizing Provider  azithromycin (ZITHROMAX) 250 MG tablet 1 po daily 06/04/16   Ivery Quale, PA-C  lisinopril-hydrochlorothiazide (PRINZIDE,ZESTORETIC) 20-25 MG per tablet Take 1 tablet by mouth daily.    [provider]  ondansetron (ZOFRAN) 8 MG tablet Take 1 tablet (8 mg total) by mouth every 8 (eight) hours as needed. 09/03/13   Zadie Rhine, MD  oxyCODONE-acetaminophen (PERCOCET/ROXICET) 5-325 MG per tablet Take 1 tablet by mouth every 4 (four) hours as needed for severe pain. 09/03/13   Zadie Rhine, MD  ranitidine (ZANTAC) 150 MG tablet Take 150 mg by mouth 2 (two) times daily as needed. For indigestion    [provider]    Allergies    Patient has no known allergies.  Review of Systems   Review of Systems  Constitutional: Negative for activity change, appetite change and fever.  HENT: Negative for congestion and rhinorrhea.   Eyes: Negative for photophobia and visual disturbance.  Respiratory: Negative for cough, chest tightness and shortness of breath.   Cardiovascular: Negative for chest pain.  Gastrointestinal: Negative for abdominal pain and vomiting.  Genitourinary: Negative for dysuria and hematuria.  Musculoskeletal: Negative for arthralgias and myalgias.  Skin: Negative for rash.  Neurological: Positive for headaches. Negative for dizziness, seizures, weakness, light-headedness and numbness.   all other systems are negative except as noted in the HPI and PMH.    Physical Exam Updated Vital Signs BP (!) 179/113   Pulse (!) 102   Temp 98.1 F (36.7 C) (Oral)   Resp 18   Ht 5' 10.5" (1.791 m)   Wt 113.4 kg   SpO2 97%   BMI 35.36 kg/m   Physical Exam Vitals and nursing note reviewed.  Constitutional:      General: He is not in acute distress.    Appearance: He is well-developed. He is obese.  HENT:     Head: Normocephalic and atraumatic.      Comments: No temporal artery tenderness    Mouth/Throat:     Pharynx: No oropharyngeal exudate.  Eyes:     Conjunctiva/sclera: Conjunctivae normal.     Pupils: Pupils are equal, round, and reactive to light.  Neck:     Comments: No meningismus. Cardiovascular:     Rate and Rhythm: Normal rate and regular rhythm.     Heart sounds: Normal heart sounds. No murmur.  Pulmonary:     Effort: Pulmonary effort is normal. No respiratory distress.     Breath sounds: Normal breath sounds.  Abdominal:     Palpations: Abdomen is soft.     Tenderness: There is no abdominal tenderness. There is no guarding or rebound.  Musculoskeletal:        General: No tenderness. Normal range of motion.     Cervical back: Normal range of motion and neck supple.     Right lower leg: Edema present.     Left lower leg: Edema present.  Skin:    General: Skin is warm.  Neurological:     General: No focal deficit present.     Mental Status: He is alert and oriented to person, place, and time. Mental status is at baseline.     Cranial Nerves: No cranial nerve deficit.     Motor: No abnormal muscle tone.     Coordination: Coordination normal.     Comments: No ataxia on finger to nose bilaterally. No pronator drift. 5/5 strength throughout. CN 2-12 intact.Equal grip strength. Sensation intact.   Psychiatric:        Behavior: Behavior normal.     ED Results / Procedures / Treatments   Labs (all labs ordered are listed, but only abnormal results are displayed) Labs Reviewed  CBC WITH DIFFERENTIAL/PLATELET - Abnormal; Notable for the following components:      Result Value   Monocytes Absolute 1.1 (*)    Eosinophils Absolute 0.6 (*)    All other components within normal limits  BASIC METABOLIC PANEL - Abnormal; Notable for the following components:   Glucose, Bld 120 (*)    BUN 26 (*)    Creatinine, Ser 1.34 (*)    GFR calc non Af Amer 60 (*)    All other components within normal limits    EKG EKG  Interpretation  Date/Time:  Monday March 27 2019 02:46:30 EDT Ventricular Rate:  90 PR Interval:    QRS Duration: 92 QT Interval:  365 QTC Calculation: 447 R Axis:   94 Text Interpretation: Sinus rhythm Borderline right axis deviation No significant change was found Confirmed by Ezequiel Essex [phone]) on 03/27/2019 2:57:33 AM   Radiology CT Head Wo Contrast  Result Date: 03/27/2019 CLINICAL DATA:  Headache upon waking EXAM: CT HEAD WITHOUT CONTRAST TECHNIQUE: Contiguous axial images  were obtained from the base of the skull through the vertex without intravenous contrast. COMPARISON:  None. FINDINGS: Brain: No evidence of acute territorial infarction, hemorrhage, hydrocephalus,extra-axial collection or mass lesion/mass effect. Normal gray-white differentiation. Ventricles are normal in size and contour. Vascular: No hyperdense vessel or unexpected calcification. Skull: The skull is intact. No fracture or focal lesion identified. Sinuses/Orbits: The visualized paranasal sinuses and mastoid air cells are clear. The orbits and globes intact. Other: None IMPRESSION: No acute intracranial abnormality. Electronically Signed   By: Jonna Clark M.D.   On: 03/27/2019 02:12    Procedures Procedures (including critical care time)  Medications Ordered in ED Medications  lisinopril (ZESTRIL) tablet 20 mg (has no administration in time range)  hydrochlorothiazide (HYDRODIURIL) tablet 25 mg (has no administration in time range)    ED Course  I have reviewed the triage vital signs and the nursing notes.  Pertinent labs & imaging results that were available during my care of the patient were reviewed by me and considered in my medical decision making (see chart for details).    MDM Rules/Calculators/A&P                     Sudden onset headache about 11 PM while patient was watching television.  Intermittent headaches for the past several weeks which have been similar but not as severe.  No focal  neurological deficits.  Hypertensive but has been off of medications for many months.  CT scan will be obtained to evaluate for subarachnoid hemorrhage though seems less likely as pain has been intermittent for several weeks but onset suddenly tonight. Low suspicion for meningitis, temporal arteritis.  CT head negative within 6 hours of sudden onset headache, effectively rules out subarachnoid hemorrhage. Creatinine is at baseline.  No evidence of hypertensive emergency.  No evidence of endorgan damage.  EKG unchanged.  Home Medications given.  Blood pressure has improved to 153/98.  Headache is improved after Toradol, Reglan and Benadryl.  Nonfocal neurological exam. Advised patient that he needs to be back on his blood pressure medication. will provide a refill.  Needs to establish care with a new PCP as his previous one has retired.  Resources given.  Advised outpatient sleep study as there is suspicion for sleep apnea as patient did desaturate while he was sleeping in the ED. Return precautions discussed.  Final Clinical Impression(s) / ED Diagnoses Final diagnoses:  Essential hypertension    Rx / DC Orders ED Discharge Orders    None       Emary Zalar, Jeannett Senior, MD 03/27/19 [phone]

## 2019-03-27 NOTE — ED Triage Notes (Signed)
Pt c/o headache that started tonight, checked his blood pressure at home and it was 210/117, pt states that he is suppose to be on lisinopril 25 mg daily but has not taken it in 4 months due to his prescription running out.

## 2019-03-27 NOTE — Discharge Instructions (Addendum)
Take your blood pressure medication as prescribed.  Establish care with a new primary doctor.  You should also have a sleep study because we are suspicious you could have sleep apnea which can be serious.  Return to the ED with worsening headache, chest pain, vision problems, unilateral weakness, numbness, tingling, any other concerns.

## 2020-11-05 ENCOUNTER — Other Ambulatory Visit: Payer: Self-pay

## 2020-11-05 ENCOUNTER — Encounter (HOSPITAL_COMMUNITY): Payer: Self-pay | Admitting: *Deleted

## 2020-11-05 ENCOUNTER — Emergency Department (HOSPITAL_COMMUNITY): Payer: 59

## 2020-11-05 ENCOUNTER — Inpatient Hospital Stay (HOSPITAL_COMMUNITY)
Admission: EM | Admit: 2020-11-05 | Discharge: 2020-11-07 | DRG: 309 | Disposition: A | Discharge status: Home / Self Care | Payer: 59 | Attending: Internal Medicine | Admitting: Internal Medicine

## 2020-11-05 DIAGNOSIS — I4892 Unspecified atrial flutter: Secondary | ICD-10-CM

## 2020-11-05 DIAGNOSIS — R55 Syncope and collapse: Secondary | ICD-10-CM | POA: Diagnosis present

## 2020-11-05 DIAGNOSIS — I130 Hypertensive heart and chronic kidney disease with heart failure and stage 1 through stage 4 chronic kidney disease, or unspecified chronic kidney disease: Secondary | ICD-10-CM | POA: Diagnosis present

## 2020-11-05 DIAGNOSIS — K219 Gastro-esophageal reflux disease without esophagitis: Secondary | ICD-10-CM | POA: Diagnosis present

## 2020-11-05 DIAGNOSIS — I483 Typical atrial flutter: Principal | ICD-10-CM | POA: Diagnosis present

## 2020-11-05 DIAGNOSIS — Z7901 Long term (current) use of anticoagulants: Secondary | ICD-10-CM

## 2020-11-05 DIAGNOSIS — Z8673 Personal history of transient ischemic attack (TIA), and cerebral infarction without residual deficits: Secondary | ICD-10-CM

## 2020-11-05 DIAGNOSIS — Z20822 Contact with and (suspected) exposure to covid-19: Secondary | ICD-10-CM | POA: Diagnosis present

## 2020-11-05 DIAGNOSIS — E1122 Type 2 diabetes mellitus with diabetic chronic kidney disease: Secondary | ICD-10-CM | POA: Diagnosis present

## 2020-11-05 DIAGNOSIS — I10 Essential (primary) hypertension: Secondary | ICD-10-CM

## 2020-11-05 DIAGNOSIS — I4891 Unspecified atrial fibrillation: Secondary | ICD-10-CM | POA: Diagnosis present

## 2020-11-05 DIAGNOSIS — Z87442 Personal history of urinary calculi: Secondary | ICD-10-CM

## 2020-11-05 DIAGNOSIS — Z28310 Unvaccinated for covid-19: Secondary | ICD-10-CM

## 2020-11-05 DIAGNOSIS — Z8249 Family history of ischemic heart disease and other diseases of the circulatory system: Secondary | ICD-10-CM

## 2020-11-05 DIAGNOSIS — E669 Obesity, unspecified: Secondary | ICD-10-CM

## 2020-11-05 DIAGNOSIS — Z713 Dietary counseling and surveillance: Secondary | ICD-10-CM

## 2020-11-05 DIAGNOSIS — N183 Chronic kidney disease, stage 3 unspecified: Secondary | ICD-10-CM | POA: Diagnosis present

## 2020-11-05 DIAGNOSIS — I959 Hypotension, unspecified: Secondary | ICD-10-CM | POA: Diagnosis present

## 2020-11-05 DIAGNOSIS — R0902 Hypoxemia: Secondary | ICD-10-CM | POA: Diagnosis present

## 2020-11-05 DIAGNOSIS — Z79899 Other long term (current) drug therapy: Secondary | ICD-10-CM

## 2020-11-05 DIAGNOSIS — N1831 Chronic kidney disease, stage 3a: Secondary | ICD-10-CM | POA: Diagnosis present

## 2020-11-05 DIAGNOSIS — Z6839 Body mass index (BMI) 39.0-39.9, adult: Secondary | ICD-10-CM

## 2020-11-05 DIAGNOSIS — R9431 Abnormal electrocardiogram [ECG] [EKG]: Secondary | ICD-10-CM

## 2020-11-05 LAB — BASIC METABOLIC PANEL
Anion gap: 9 (ref 5–15)
BUN: 25 mg/dL — ABNORMAL HIGH (ref 6–20)
CO2: 29 mmol/L (ref 22–32)
Calcium: 9.1 mg/dL (ref 8.9–10.3)
Chloride: 99 mmol/L (ref 98–111)
Creatinine, Ser: 1.51 mg/dL — ABNORMAL HIGH (ref 0.61–1.24)
GFR, Estimated: 54 mL/min — ABNORMAL LOW (ref >60)
Glucose, Bld: 111 mg/dL — ABNORMAL HIGH (ref 70–99)
Potassium: 3.6 mmol/L (ref 3.5–5.1)
Sodium: 137 mmol/L (ref 135–145)

## 2020-11-05 LAB — CBC
HCT: 49.2 % (ref 39.0–52.0)
Hemoglobin: 15.9 g/dL (ref 13.0–17.0)
MCH: 32.6 pg (ref 26.0–34.0)
MCHC: 32.3 g/dL (ref 30.0–36.0)
MCV: 101 fL — ABNORMAL HIGH (ref 80.0–100.0)
Platelets: 157 10*3/uL (ref 150–400)
RBC: 4.87 MIL/uL (ref 4.22–5.81)
RDW: 12.9 % (ref 11.5–15.5)
WBC: 9.5 10*3/uL (ref 4.0–10.5)
nRBC: 0 % (ref 0.0–0.2)

## 2020-11-05 LAB — PROTIME-INR
INR: 1 (ref 0.8–1.2)
Prothrombin Time: 13.2 seconds (ref 11.4–15.2)

## 2020-11-05 LAB — TROPONIN I (HIGH SENSITIVITY)
Troponin I (High Sensitivity): 6 ng/L (ref <18)
Troponin I (High Sensitivity): 5 ng/L (ref <18)

## 2020-11-05 LAB — RESP PANEL BY RT-PCR (FLU A&B, COVID) ARPGX2
Influenza A by PCR: NEGATIVE
Influenza B by PCR: NEGATIVE
SARS Coronavirus 2 by RT PCR: NEGATIVE

## 2020-11-05 LAB — MAGNESIUM: Magnesium: 1.9 mg/dL (ref 1.7–2.4)

## 2020-11-05 LAB — TSH: TSH: 3.067 u[IU]/mL (ref 0.350–4.500)

## 2020-11-05 MED ORDER — DILTIAZEM LOAD VIA INFUSION
15.0000 mg | Freq: Once | INTRAVENOUS | Status: AC
  Administered 2020-11-05: 15 mg via INTRAVENOUS
  Filled 2020-11-05: qty 15

## 2020-11-05 MED ORDER — LISINOPRIL 10 MG PO TABS
20.0000 mg | ORAL_TABLET | Freq: Every day | ORAL | Status: DC
  Filled 2020-11-05: qty 2

## 2020-11-05 MED ORDER — LISINOPRIL-HYDROCHLOROTHIAZIDE 20-25 MG PO TABS: 1.0000 | ORAL_TABLET | Freq: Every day | ORAL | Status: DC

## 2020-11-05 MED ORDER — DILTIAZEM HCL-DEXTROSE 125-5 MG/125ML-% IV SOLN (PREMIX)
5.0000 mg/h | INTRAVENOUS | Status: DC
  Administered 2020-11-05: 5 mg/h via INTRAVENOUS
  Administered 2020-11-06 (×3): 15 mg/h via INTRAVENOUS
  Filled 2020-11-05 (×4): qty 125

## 2020-11-05 MED ORDER — MORPHINE SULFATE (PF) 2 MG/ML IV SOLN: 2.0000 mg | INTRAVENOUS | Status: DC | PRN

## 2020-11-05 MED ORDER — ACETAMINOPHEN 650 MG RE SUPP: 650.0000 mg | Freq: Four times a day (QID) | RECTAL | Status: DC | PRN

## 2020-11-05 MED ORDER — APIXABAN 5 MG PO TABS
5.0000 mg | ORAL_TABLET | Freq: Two times a day (BID) | ORAL | Status: DC
  Administered 2020-11-05 – 2020-11-07 (×4): 5 mg via ORAL
  Filled 2020-11-05 (×4): qty 1

## 2020-11-05 MED ORDER — HYDROCHLOROTHIAZIDE 25 MG PO TABS
25.0000 mg | ORAL_TABLET | Freq: Every day | ORAL | Status: DC
  Filled 2020-11-05: qty 1

## 2020-11-05 MED ORDER — ACETAMINOPHEN 325 MG PO TABS: 650.0000 mg | ORAL_TABLET | Freq: Four times a day (QID) | ORAL | Status: DC | PRN

## 2020-11-05 MED ORDER — OXYCODONE HCL 5 MG PO TABS: 5.0000 mg | ORAL_TABLET | ORAL | Status: DC | PRN

## 2020-11-05 MED ORDER — ONDANSETRON HCL 4 MG PO TABS: 4.0000 mg | ORAL_TABLET | Freq: Four times a day (QID) | ORAL | Status: DC | PRN

## 2020-11-05 MED ORDER — ONDANSETRON HCL 4 MG/2ML IJ SOLN: 4.0000 mg | Freq: Four times a day (QID) | INTRAMUSCULAR | Status: DC | PRN

## 2020-11-05 NOTE — H&P (Signed)
TRH H&P    Patient Demographics:    Mitchell Nichols, is a 55 y.o. male  MRN: [id number]  DOB - September 01, 1965  Admit Date - 11/05/2020  Referring MD/NP/PA: Charm Barges  Outpatient Primary MD for the patient is Rebekah Chesterfield, NP  Patient coming from: Home  Chief complaint- dyspnea, fatigue and dizziness   HPI:    Mitchell Nichols  is a 55 y.o. male, with history of hernia, HTN, and nephrolithiasis, presents to the ED with a chief complaint of dyspnea, dizziness, and fatigue. Patient reports that these symptoms have been intermittent for 3-4 weeks. They are worse with exertion, and better with rest. The symptoms seem to be worse with each episode. He has no associated cough, fever, nausea, chest pain, or palpitations. More recently he has had near syncopal events, so he made an appt with PCP. PCP office did an EKG and found him to be in aflutter, so they sent him to the ED. Patient does not drink EtOH, has not had infectious symptoms, has no known history of lung disease or thyroid disease. Patient has no known cardiac anatomy abnormalities. He is not sure if he snores at night, but does report waking in the night, not feeling rested in the AM, and sometimes waking up with a headache. Patient reports compliance with his HCTZ-lisinopril. Patient has no other complaints at this time.   Does not drink, smoke, or use illicit drugs. He is not vaccinated for covid. Patient prefers to be FULL CODE.  In the ED A febrile, tachycardic, initially with tachypnea - but normalized in the ED, normotensive, and satting at 91% on room air No leukocytosis, hgb stable Chemistry panel reveals elevated Cr - likely patient's baseline when compared to older labs CXR = mildly enlarged cardiac silhouette, but could be due to AP portable imaging ED spoke to cards who advised diltiazem drip, eliquis, and see cardiology in the AM      Review of  systems:    In addition to the HPI above,  No Fever-chills, No Headache, No changes with Vision or hearing, No problems swallowing food or Liquids, No Chest pain, Cough admits to dyspnea, No Abdominal pain, No Nausea or Vomiting, bowel movements are regular, No Blood in stool or Urine, No dysuria, No new skin rashes or bruises, No new joints pains-aches,  No new weakness, tingling, numbness in any extremity, 14 pounds intentional weight loss No polyuria, polydypsia or polyphagia, No significant Mental Stressors.  All other systems reviewed and are negative.    Past History of the following :    Past Medical History:  Diagnosis Date   Hernia    Hypertension    Kidney stones       Past Surgical History:  Procedure Laterality Date   HAND SURGERY Left    HERNIA REPAIR     KNEE SURGERY        Social History:      Social History   Tobacco Use   Smoking status: Never   Smokeless tobacco: Never  Substance Use Topics  Alcohol use: No       Family History :    No family history on file. Fam history of HTN   Home Medications:   Prior to Admission medications   Medication Sig Start Date End Date Taking? Authorizing Provider  ibuprofen (ADVIL) 400 MG tablet Take 400 mg by mouth every 6 (six) hours as needed.   Yes [provider]  lisinopril-hydrochlorothiazide (ZESTORETIC) 20-25 MG tablet Take 1 tablet by mouth daily. 03/27/19  Yes Rancour, Annie Main, MD  azithromycin (ZITHROMAX) 250 MG tablet 1 po daily Patient not taking: Reported on 11/05/2020 06/04/16   Lily Kocher, PA-C  ondansetron (ZOFRAN) 8 MG tablet Take 1 tablet (8 mg total) by mouth every 8 (eight) hours as needed. Patient not taking: Reported on 11/05/2020 09/03/13   Ripley Fraise, MD  oxyCODONE-acetaminophen (PERCOCET/ROXICET) 5-325 MG per tablet Take 1 tablet by mouth every 4 (four) hours as needed for severe pain. Patient not taking: Reported on 11/05/2020 09/03/13   Ripley Fraise, MD   ranitidine (ZANTAC) 150 MG tablet Take 150 mg by mouth 2 (two) times daily as needed. For indigestion Patient not taking: Reported on 11/05/2020    [provider]     Allergies:    No Known Allergies   Physical Exam:   Vitals  Blood pressure (!) 124/96, pulse (!) 135, temperature 98.2 F (36.8 C), temperature source Oral, resp. rate 16, height 5\' 11"  (1.803 m), weight 127.9 kg, SpO2 93 %.   1.  General: Patient lying supine in bed,  no acute distress   2. Psychiatric: Alert and oriented x 3, mood and behavior normal for situation, pleasant and cooperative with exam   3. Neurologic: Speech and language are normal, face is symmetric, moves all 4 extremities voluntarily, at baseline without acute deficits on limited exam   4. HEENMT:  Head is atraumatic, normocephalic, pupils reactive to light, neck is supple, trachea is midline, mucous membranes are moist   5. Respiratory : Lungs are clear to auscultation bilaterally without wheezing, rhonchi, rales, no cyanosis, no increase in work of breathing or accessory muscle use   6. Cardiovascular : Heart rate tachycardic with irregularly irregular rhythm, no murmurs, rubs or gallops, 2+ peripheral edema at ankles, peripheral pulses palpated   7. Gastrointestinal:  Abdomen is soft, nondistended, nontender to palpation bowel sounds active, no masses or organomegaly palpated   8. Skin:  Skin is warm, dry and intact without rashes, acute lesions, or ulcers on limited exam   9.Musculoskeletal:  No acute deformities or trauma, no asymmetry in tone, peripheral pulses palpated, no tenderness to palpation in the extremities     Data Review:    CBC Recent Labs  Lab 11/05/20 1809  WBC 9.5  HGB 15.9  HCT 49.2  PLT 157  MCV 101.0*  MCH 32.6  MCHC 32.3  RDW 12.9   ------------------------------------------------------------------------------------------------------------------  Results for orders placed or performed  during the hospital encounter of 11/05/20 (from the past 48 hour(s))  Basic metabolic panel     Status: Abnormal   Collection Time: 11/05/20  6:09 PM  Result Value Ref Range   Sodium 137 135 - 145 mmol/L   Potassium 3.6 3.5 - 5.1 mmol/L   Chloride 99 98 - 111 mmol/L   CO2 29 22 - 32 mmol/L   Glucose, Bld 111 (H) 70 - 99 mg/dL    Comment: Glucose reference range applies only to samples taken after fasting for at least 8 hours.   BUN 25 (H)  6 - 20 mg/dL   Creatinine, Ser 4.091.51 (H) 0.61 - 1.24 mg/dL   Calcium 9.1 8.9 - 81.110.3 mg/dL   GFR, Estimated 54 (L) >60 mL/min    Comment: (NOTE) Calculated using the CKD-EPI Creatinine Equation (2021)    Anion gap 9 5 - 15    Comment: Performed at [facility], [address] [place], [place] [address]  CBC     Status: Abnormal   Collection Time: 11/05/20  6:09 PM  Result Value Ref Range   WBC 9.5 4.0 - 10.5 K/uL   RBC 4.87 4.22 - 5.81 MIL/uL   Hemoglobin 15.9 13.0 - 17.0 g/dL   HCT 29.549.2 62.139.0 - 30.852.0 %   MCV 101.0 (H) 80.0 - 100.0 fL   MCH 32.6 26.0 - 34.0 pg   MCHC 32.3 30.0 - 36.0 g/dL   RDW 65.712.9 84.611.5 - 96.215.5 %   Platelets 157 150 - 400 K/uL   nRBC 0.0 0.0 - 0.2 %    Comment: Performed at [facility], [address] [place], [place] [address]  Troponin I (High Sensitivity)     Status: None   Collection Time: 11/05/20  6:09 PM  Result Value Ref Range   Troponin I (High Sensitivity) 6 <18 ng/L    Comment: (NOTE) Elevated high sensitivity troponin I (hsTnI) values and significant  changes across serial measurements may suggest ACS but many other  chronic and acute conditions are known to elevate hsTnI results.  Refer to the "Links" section for chest pain algorithms and additional  guidance. Performed at [facility], [address] [place], [place] [address]   Magnesium     Status: None   Collection Time: 11/05/20  6:24 PM  Result Value Ref Range   Magnesium 1.9 1.7 - 2.4 mg/dL    Comment: Performed at [facility], [address]  [place], [place] [address]  Protime-INR (if pt is taking coumadin)     Status: None   Collection Time: 11/05/20  6:24 PM  Result Value Ref Range   Prothrombin Time 13.2 11.4 - 15.2 seconds   INR 1.0 0.8 - 1.2    Comment: (NOTE) INR goal varies based on device and disease states. Performed at [facility], [address] [place], [place] [address]   Troponin I (High Sensitivity)     Status: None   Collection Time: 11/05/20  8:08 PM  Result Value Ref Range   Troponin I (High Sensitivity) 5 <18 ng/L    Comment: (NOTE) Elevated high sensitivity troponin I (hsTnI) values and significant  changes across serial measurements may suggest ACS but many other  chronic and acute conditions are known to elevate hsTnI results.  Refer to the "Links" section for chest pain algorithms and additional  guidance. Performed at [facility], [address] [place], [place] [address]   Resp Panel by RT-PCR (Flu A&B, Covid) Nasopharyngeal Swab     Status: None   Collection Time: 11/05/20  8:21 PM   Specimen: Nasopharyngeal Swab; Nasopharyngeal(NP) swabs in vial transport medium  Result Value Ref Range   SARS Coronavirus 2 by RT PCR NEGATIVE NEGATIVE    Comment: (NOTE) SARS-CoV-2 target nucleic acids are NOT DETECTED.  The SARS-CoV-2 RNA is generally detectable in upper respiratory specimens during the acute phase of infection. The lowest concentration of SARS-CoV-2 viral copies this assay can detect is 138 copies/mL. A negative result does not preclude SARS-Cov-2 infection and should not be used as the sole basis for treatment or other  patient management decisions. A negative result may occur with  improper specimen collection/handling, submission of specimen other than nasopharyngeal swab, presence of viral mutation(s) within the areas targeted by this assay, and inadequate number of viral copies(<138 copies/mL). A negative result must be combined with clinical observations, patient history, and  epidemiological information. The expected result is Negative.  Fact Sheet for Patients:  EntrepreneurPulse.com.au  Fact Sheet for Healthcare Providers:  IncredibleEmployment.be  This test is no t yet approved or cleared by the [place] FDA and  has been authorized for detection and/or diagnosis of SARS-CoV-2 by FDA under an Emergency Use Authorization (EUA). This EUA will remain  in effect (meaning this test can be used) for the duration of the COVID-19 declaration under Section 564(b)(1) of the Act, 21 U.S.C.section 360bbb-3(b)(1), unless the authorization is terminated  or revoked sooner.       Influenza A by PCR NEGATIVE NEGATIVE   Influenza B by PCR NEGATIVE NEGATIVE    Comment: (NOTE) The Xpert Xpress SARS-CoV-2/FLU/RSV plus assay is intended as an aid in the diagnosis of influenza from Nasopharyngeal swab specimens and should not be used as a sole basis for treatment. Nasal washings and aspirates are unacceptable for Xpert Xpress SARS-CoV-2/FLU/RSV testing.  Fact Sheet for Patients: EntrepreneurPulse.com.au  Fact Sheet for Healthcare Providers: IncredibleEmployment.be  This test is not yet approved or cleared by the [place] FDA and has been authorized for detection and/or diagnosis of SARS-CoV-2 by FDA under an Emergency Use Authorization (EUA). This EUA will remain in effect (meaning this test can be used) for the duration of the COVID-19 declaration under Section 564(b)(1) of the Act, 21 U.S.C. section 360bbb-3(b)(1), unless the authorization is terminated or revoked.  Performed at [facility], [address], [place], [place] [address]     Chemistries  Recent Labs  Lab 11/05/20 1809 11/05/20 1824  NA 137  --   K 3.6  --   CL 99  --   CO2 29  --   GLUCOSE 111*  --   BUN 25*  --   CREATININE 1.51*  --   CALCIUM 9.1  --   MG  --  1.9    ------------------------------------------------------------------------------------------------------------------  ------------------------------------------------------------------------------------------------------------------ GFR: Estimated Creatinine Clearance: 74.4 mL/min (A) (by C-G formula based on SCr of 1.51 mg/dL (H)). Liver Function Tests: No results for input(s): AST, ALT, ALKPHOS, BILITOT, PROT, ALBUMIN in the last 168 hours. No results for input(s): LIPASE, AMYLASE in the last 168 hours. No results for input(s): AMMONIA in the last 168 hours. Coagulation Profile: Recent Labs  Lab 11/05/20 1824  INR 1.0   Cardiac Enzymes: No results for input(s): CKTOTAL, CKMB, CKMBINDEX, TROPONINI in the last 168 hours. BNP (last 3 results) No results for input(s): PROBNP in the last 8760 hours. HbA1C: No results for input(s): HGBA1C in the last 72 hours. CBG: No results for input(s): GLUCAP in the last 168 hours. Lipid Profile: No results for input(s): CHOL, HDL, LDLCALC, TRIG, CHOLHDL, LDLDIRECT in the last 72 hours. Thyroid Function Tests: No results for input(s): TSH, T4TOTAL, FREET4, T3FREE, THYROIDAB in the last 72 hours. Anemia Panel: No results for input(s): VITAMINB12, FOLATE, FERRITIN, TIBC, IRON, RETICCTPCT in the last 72 hours.  --------------------------------------------------------------------------------------------------------------- Urine analysis:    Component Value Date/Time   COLORURINE YELLOW 09/03/2013 2145   APPEARANCEUR CLEAR 09/03/2013 2145   LABSPEC 1.025 09/03/2013 2145   PHURINE 5.0 09/03/2013 2145   GLUCOSEU NEGATIVE 09/03/2013 2145   HGBUR LARGE (A) 09/03/2013 2145   BILIRUBINUR  NEGATIVE 09/03/2013 2145   KETONESUR NEGATIVE 09/03/2013 2145   PROTEINUR NEGATIVE 09/03/2013 2145   UROBILINOGEN 0.2 09/03/2013 2145   NITRITE NEGATIVE 09/03/2013 2145   LEUKOCYTESUR NEGATIVE 09/03/2013 2145      Imaging Results:    DG Chest Port 1  View  Result Date: 11/05/2020 CLINICAL DATA:  Abnormal EKG. Rapid heart rate chest pain and shortness of breath. EXAM: PORTABLE CHEST 1 VIEW COMPARISON:  Chest radiograph 06/03/2016 FINDINGS: Mildly enlarged cardiopericardial silhouette, may be accentuated by portable AP technique. Stable mediastinal contours. No pulmonary edema, focal airspace disease, pleural effusion or pneumothorax. IMPRESSION: Mildly enlarged cardiopericardial silhouette, may be accentuated by portable AP technique. Electronically Signed   By: Keith Rake M.D.   On: 11/05/2020 18:20    My personal review of EKG: Rhythm a flutter, Rate 138 /min, QTc 533 ,no Acute ST changes   Assessment & Plan:    Active Problems:   Atrial flutter (HCC)   HTN (hypertension)   CKD (chronic kidney disease), stage III (HCC)   Obesity (BMI 35.0-39.9 without comorbidity)   A flutter HR 138 Symptomatic for 3 -4  weeks Start eliquis and continue cardizem drip Echo in the AM Patient denies EtOH No infectious symptoms No known lung disease, but obesity hypoventilation syndrome should be considered No diagnosis of OSA - but should be considered TSH pending Trop normal 6, 5 Resp panel pending Continue to monitor on tele HTN Continue lisinopril HCTZ Continue to monitor CKD  Stage III Cr is close to baseline GFR decrease from 60 to 54 over 1 year Continue to monitor Obesity Counseled on the importance of weight loss Patient reports losing 14 lbs, intentionally - cutting out bread Advised to continue the good work    DVT Prophylaxis-   Eliquis - SCDs   AM Labs Ordered, also please review Full Orders  Family Communication: No family at bedside  Code Status:  Full  Admission status: Observation  Disposition: Anticipated Discharge: 24 - 48 hours Discharge to Home  Time spent in minutes : Anton Chico DO

## 2020-11-05 NOTE — ED Provider Notes (Signed)
[facility] EMERGENCY DEPARTMENT Provider Note   CSN: [id number] Arrival date & time: 11/05/20  1742     History Chief Complaint  Patient presents with   Abnormal ECG    Mitchell Nichols is a 55 y.o. male.  He has a history of hypertension.  He saw his PCP today complaining of 1 month of fatigue lightheadedness rapid heart rate.  No history of arrhythmias.  Not on any anticoagulation.  Denies any chest pain.  The history is provided by the patient.  Weakness Severity:  Moderate Onset quality:  Gradual Duration:  4 weeks Timing:  Constant Progression:  Unchanged Chronicity:  New Context: not alcohol use and not drug use   Relieved by:  Nothing Worsened by:  Activity Ineffective treatments:  Rest Associated symptoms: dizziness   Associated symptoms: no abdominal pain, no chest pain, no cough, no difficulty walking, no dysuria, no fever, no loss of consciousness, no nausea, no shortness of breath, no stroke symptoms and no vomiting       Past Medical History:  Diagnosis Date   Hernia    Hypertension    Kidney stones     There are no problems to display for this patient.   Past Surgical History:  Procedure Laterality Date   HAND SURGERY Left    HERNIA REPAIR     KNEE SURGERY         No family history on file.  Social History   Tobacco Use   Smoking status: Never   Smokeless tobacco: Never  Vaping Use   Vaping Use: Never used  Substance Use Topics   Alcohol use: No   Drug use: No    Home Medications Prior to Admission medications   Medication Sig Start Date End Date Taking? Authorizing Provider  azithromycin (ZITHROMAX) 250 MG tablet 1 po daily 06/04/16   Lily Kocher, PA-C  lisinopril-hydrochlorothiazide (ZESTORETIC) 20-25 MG tablet Take 1 tablet by mouth daily. 03/27/19   Rancour, Annie Main, MD  ondansetron (ZOFRAN) 8 MG tablet Take 1 tablet (8 mg total) by mouth every 8 (eight) hours as needed. 09/03/13   Ripley Fraise, MD  oxyCODONE-acetaminophen  (PERCOCET/ROXICET) 5-325 MG per tablet Take 1 tablet by mouth every 4 (four) hours as needed for severe pain. 09/03/13   Ripley Fraise, MD  ranitidine (ZANTAC) 150 MG tablet Take 150 mg by mouth 2 (two) times daily as needed. For indigestion    [provider]    Allergies    Patient has no known allergies.  Review of Systems   Review of Systems  Constitutional:  Negative for fever.  HENT:  Negative for sore throat.   Eyes:  Negative for visual disturbance.  Respiratory:  Negative for cough and shortness of breath.   Cardiovascular:  Positive for palpitations. Negative for chest pain.  Gastrointestinal:  Negative for abdominal pain, nausea and vomiting.  Genitourinary:  Negative for dysuria.  Musculoskeletal:  Negative for neck pain.  Skin:  Negative for rash.  Neurological:  Positive for dizziness and weakness. Negative for loss of consciousness.   Physical Exam Updated Vital Signs BP (!) 121/94   Pulse (!) 140   Temp 97.7 F (36.5 C) (Oral)   Resp 18   Ht 5\' 11"  (1.803 m)   Wt 127.9 kg   SpO2 97%   BMI 39.33 kg/m   Physical Exam Vitals and nursing note reviewed.  Constitutional:      Appearance: Normal appearance. He is well-developed. He is obese.  HENT:  Head: Normocephalic and atraumatic.  Eyes:     Conjunctiva/sclera: Conjunctivae normal.  Cardiovascular:     Rate and Rhythm: Regular rhythm. Tachycardia present.     Heart sounds: No murmur heard. Pulmonary:     Effort: Pulmonary effort is normal. No respiratory distress.     Breath sounds: Normal breath sounds.  Abdominal:     Palpations: Abdomen is soft.     Tenderness: There is no abdominal tenderness. There is no guarding or rebound.  Musculoskeletal:        General: Normal range of motion.     Cervical back: Neck supple.     Right lower leg: No edema.     Left lower leg: No edema.  Skin:    General: Skin is warm and dry.  Neurological:     General: No focal deficit present.     Mental  Status: He is alert.    ED Results / Procedures / Treatments   Labs (all labs ordered are listed, but only abnormal results are displayed) Labs Reviewed  BASIC METABOLIC PANEL - Abnormal; Notable for the following components:      Result Value   Glucose, Bld 111 (*)    BUN 25 (*)    Creatinine, Ser 1.51 (*)    GFR, Estimated 54 (*)    All other components within normal limits  CBC - Abnormal; Notable for the following components:   MCV 101.0 (*)    All other components within normal limits  COMPREHENSIVE METABOLIC PANEL - Abnormal; Notable for the following components:   Glucose, Bld 107 (*)    BUN 23 (*)    Creatinine, Ser 1.41 (*)    Calcium 8.8 (*)    GFR, Estimated 58 (*)    All other components within normal limits  RESP PANEL BY RT-PCR (FLU A&B, COVID) ARPGX2  MRSA NEXT GEN BY PCR, NASAL  MAGNESIUM  TSH  PROTIME-INR  MAGNESIUM  CBC WITH DIFFERENTIAL/PLATELET  TSH  URINALYSIS, ROUTINE W REFLEX MICROSCOPIC  HIV ANTIBODY (ROUTINE TESTING W REFLEX)  TROPONIN I (HIGH SENSITIVITY)  TROPONIN I (HIGH SENSITIVITY)    EKG EKG Interpretation  Date/Time:  Tuesday November 05 2020 17:54:02 EDT Ventricular Rate:  138 PR Interval:    QRS Duration: 82 QT Interval:  352 QTC Calculation: 533 R Axis:   97 Text Interpretation: Atrial flutter with 2:1 A-V conduction Rightward axis Nonspecific ST and T wave abnormality Abnormal ECG aflutter new from prior 3/21 Confirmed by Meridee Score [phone]) on 11/05/2020 6:10:53 PM  Radiology DG Chest Port 1 View  Result Date: 11/05/2020 CLINICAL DATA:  Abnormal EKG. Rapid heart rate chest pain and shortness of breath. EXAM: PORTABLE CHEST 1 VIEW COMPARISON:  Chest radiograph 06/03/2016 FINDINGS: Mildly enlarged cardiopericardial silhouette, may be accentuated by portable AP technique. Stable mediastinal contours. No pulmonary edema, focal airspace disease, pleural effusion or pneumothorax. IMPRESSION: Mildly enlarged cardiopericardial  silhouette, may be accentuated by portable AP technique. Electronically Signed   By: Narda Rutherford M.D.   On: 11/05/2020 18:20    Procedures .Critical Care Performed by: Terrilee Files, MD Authorized by: Terrilee Files, MD   Critical care provider statement:    Critical care time (minutes):  45   Critical care time was exclusive of:  Separately billable procedures and treating other patients   Critical care was necessary to treat or prevent imminent or life-threatening deterioration of the following conditions:  Cardiac failure   Critical care was time spent personally by me on  the following activities:  Development of treatment plan with patient or surrogate, discussions with consultants, evaluation of patient's response to treatment, examination of patient, obtaining history from patient or surrogate, ordering and performing treatments and interventions, ordering and review of laboratory studies, ordering and review of radiographic studies, pulse oximetry, re-evaluation of patient's condition and review of old charts   I assumed direction of critical care for this patient from another provider in my specialty: no     Medications Ordered in ED Medications  diltiazem (CARDIZEM) 1 mg/mL load via infusion 15 mg (15 mg Intravenous Bolus from Bag 11/05/20 1830)    And  diltiazem (CARDIZEM) 125 mg in dextrose 5% 125 mL (1 mg/mL) infusion (15 mg/hr Intravenous Infusion Verify 11/06/20 0700)  apixaban (ELIQUIS) tablet 5 mg (5 mg Oral Given 11/06/20 1012)  acetaminophen (TYLENOL) tablet 650 mg (has no administration in time range)    Or  acetaminophen (TYLENOL) suppository 650 mg (has no administration in time range)  oxyCODONE (Oxy IR/ROXICODONE) immediate release tablet 5 mg (has no administration in time range)  morphine 2 MG/ML injection 2 mg (has no administration in time range)  ondansetron (ZOFRAN) tablet 4 mg (has no administration in time range)    Or  ondansetron (ZOFRAN) injection  4 mg (has no administration in time range)  Chlorhexidine Gluconate Cloth 2 % PADS 6 each (6 each Topical Given 11/06/20 1013)    ED Course  I have reviewed the triage vital signs and the nursing notes.  Pertinent labs & imaging results that were available during my care of the patient were reviewed by me and considered in my medical decision making (see chart for details).  Clinical Course as of 11/06/20 1013  Tue Nov 05, 2020  1832 Chest x-ray does not show any acute infiltrates.  Awaiting radiology reading. [MB]  1952 Discussed with Dr. Johnsie Cancel [facility] cardiology.  Recommends continuing Cardizem drip, start Eliquis, cardiology team will see tomorrow.  [facility] is fine for admission. [MB]    Clinical Course User Index [MB] Hayden Rasmussen, MD   MDM Rules/Calculators/A&P                          This patient complains of weakness palpitations elevated heart rate; this involves an extensive number of treatment Options and is a complaint that carries with it a high risk of complications and Morbidity. The differential includes arrhythmia, tachycardia, ACS, hyperthyroidism, dehydration, anemia, metabolic derangement  I ordered, reviewed and interpreted labs, which included CBC with normal white count normal hemoglobin, chemistries with elevated creatinine unclear baseline, coags normal, TSH normal, troponins flat I ordered medication IV Cardizem bolus and drip, oral Eliquis I ordered imaging studies which included chest x-ray and I independently    visualized and interpreted imaging which showed no acute findings Previous records obtained and reviewed in epic no recent admissions I consulted Dr. Johnsie Cancel cardiology and [facility] hospitalist Dr. Clearence Ped And discussed lab and imaging findings  Critical Interventions: Initiation of IV drip of Cardizem for control of patient's rate.  Initiation of anticoagulation for new onset A. fib.  After the interventions stated above, I reevaluated the  patient and found patient to be fairly asymptomatic although remains tachycardic with soft blood pressures.  No indications for acute cardioversion and patient at elevated risk as this is probably been going on for a month.  Patient agreeable to admission to the hospital for further management.   Final Clinical Impression(s) /  ED Diagnoses Final diagnoses:  Atrial fibrillation with rapid ventricular response ([place])  Hypotension, unspecified hypotension type    Rx / DC Orders ED Discharge Orders     None        Hayden Rasmussen, MD 11/06/20 1018

## 2020-11-05 NOTE — Progress Notes (Signed)
  Received call from Dr Charm Barges regarding patient at [facility].  55 y.o. presenting with rapid atrial flutter. History HTN Symptoms for a month Will be admitted by hospitalist And seen by cardiology at [facility] in am  IV cardizem Eliquis 5 bid as we don't know if he will need cardioversion TTE in am   Charlton Haws MD [facility]

## 2020-11-05 NOTE — Plan of Care (Signed)

## 2020-11-05 NOTE — ED Triage Notes (Signed)
Fatigue x 1 month, no pain

## 2020-11-06 ENCOUNTER — Observation Stay (HOSPITAL_COMMUNITY): Payer: 59

## 2020-11-06 ENCOUNTER — Encounter (HOSPITAL_COMMUNITY): Payer: Self-pay | Admitting: Family Medicine

## 2020-11-06 DIAGNOSIS — I4891 Unspecified atrial fibrillation: Secondary | ICD-10-CM | POA: Diagnosis not present

## 2020-11-06 DIAGNOSIS — Z6839 Body mass index (BMI) 39.0-39.9, adult: Secondary | ICD-10-CM | POA: Diagnosis not present

## 2020-11-06 DIAGNOSIS — Z87442 Personal history of urinary calculi: Secondary | ICD-10-CM | POA: Diagnosis not present

## 2020-11-06 DIAGNOSIS — I130 Hypertensive heart and chronic kidney disease with heart failure and stage 1 through stage 4 chronic kidney disease, or unspecified chronic kidney disease: Secondary | ICD-10-CM | POA: Diagnosis not present

## 2020-11-06 DIAGNOSIS — I340 Nonrheumatic mitral (valve) insufficiency: Secondary | ICD-10-CM | POA: Diagnosis not present

## 2020-11-06 DIAGNOSIS — I959 Hypotension, unspecified: Secondary | ICD-10-CM | POA: Diagnosis not present

## 2020-11-06 DIAGNOSIS — N182 Chronic kidney disease, stage 2 (mild): Secondary | ICD-10-CM

## 2020-11-06 DIAGNOSIS — E1122 Type 2 diabetes mellitus with diabetic chronic kidney disease: Secondary | ICD-10-CM | POA: Diagnosis not present

## 2020-11-06 DIAGNOSIS — N1831 Chronic kidney disease, stage 3a: Secondary | ICD-10-CM | POA: Diagnosis not present

## 2020-11-06 DIAGNOSIS — Z8249 Family history of ischemic heart disease and other diseases of the circulatory system: Secondary | ICD-10-CM | POA: Diagnosis not present

## 2020-11-06 DIAGNOSIS — I4892 Unspecified atrial flutter: Secondary | ICD-10-CM | POA: Diagnosis present

## 2020-11-06 DIAGNOSIS — Z8673 Personal history of transient ischemic attack (TIA), and cerebral infarction without residual deficits: Secondary | ICD-10-CM | POA: Diagnosis not present

## 2020-11-06 DIAGNOSIS — I483 Typical atrial flutter: Secondary | ICD-10-CM | POA: Diagnosis present

## 2020-11-06 DIAGNOSIS — K219 Gastro-esophageal reflux disease without esophagitis: Secondary | ICD-10-CM | POA: Diagnosis not present

## 2020-11-06 DIAGNOSIS — Z28310 Unvaccinated for covid-19: Secondary | ICD-10-CM | POA: Diagnosis not present

## 2020-11-06 DIAGNOSIS — Z713 Dietary counseling and surveillance: Secondary | ICD-10-CM | POA: Diagnosis not present

## 2020-11-06 DIAGNOSIS — R55 Syncope and collapse: Secondary | ICD-10-CM | POA: Diagnosis not present

## 2020-11-06 DIAGNOSIS — Z79899 Other long term (current) drug therapy: Secondary | ICD-10-CM | POA: Diagnosis not present

## 2020-11-06 DIAGNOSIS — Z20822 Contact with and (suspected) exposure to covid-19: Secondary | ICD-10-CM | POA: Diagnosis not present

## 2020-11-06 DIAGNOSIS — Z7901 Long term (current) use of anticoagulants: Secondary | ICD-10-CM | POA: Diagnosis not present

## 2020-11-06 DIAGNOSIS — R0902 Hypoxemia: Secondary | ICD-10-CM | POA: Diagnosis not present

## 2020-11-06 LAB — CBC WITH DIFFERENTIAL/PLATELET
Abs Immature Granulocytes: 0.07 10*3/uL (ref 0.00–0.07)
Basophils Absolute: 0.1 10*3/uL (ref 0.0–0.1)
Basophils Relative: 1 %
Eosinophils Absolute: 0.4 10*3/uL (ref 0.0–0.5)
Eosinophils Relative: 4 %
HCT: 46.1 % (ref 39.0–52.0)
Hemoglobin: 15.3 g/dL (ref 13.0–17.0)
Immature Granulocytes: 1 %
Lymphocytes Relative: 24 %
Lymphs Abs: 2.3 10*3/uL (ref 0.7–4.0)
MCH: 32.8 pg (ref 26.0–34.0)
MCHC: 33.2 g/dL (ref 30.0–36.0)
MCV: 98.9 fL (ref 80.0–100.0)
Monocytes Absolute: 0.9 10*3/uL (ref 0.1–1.0)
Monocytes Relative: 10 %
Neutro Abs: 5.7 10*3/uL (ref 1.7–7.7)
Neutrophils Relative %: 60 %
Platelets: 150 10*3/uL (ref 150–400)
RBC: 4.66 MIL/uL (ref 4.22–5.81)
RDW: 12.9 % (ref 11.5–15.5)
WBC: 9.4 10*3/uL (ref 4.0–10.5)
nRBC: 0 % (ref 0.0–0.2)

## 2020-11-06 LAB — COMPREHENSIVE METABOLIC PANEL
ALT: 39 U/L (ref 0–44)
AST: 31 U/L (ref 15–41)
Albumin: 3.6 g/dL (ref 3.5–5.0)
Alkaline Phosphatase: 62 U/L (ref 38–126)
Anion gap: 7 (ref 5–15)
BUN: 23 mg/dL — ABNORMAL HIGH (ref 6–20)
CO2: 28 mmol/L (ref 22–32)
Calcium: 8.8 mg/dL — ABNORMAL LOW (ref 8.9–10.3)
Chloride: 102 mmol/L (ref 98–111)
Creatinine, Ser: 1.41 mg/dL — ABNORMAL HIGH (ref 0.61–1.24)
GFR, Estimated: 58 mL/min — ABNORMAL LOW (ref >60)
Glucose, Bld: 107 mg/dL — ABNORMAL HIGH (ref 70–99)
Potassium: 3.7 mmol/L (ref 3.5–5.1)
Sodium: 137 mmol/L (ref 135–145)
Total Bilirubin: 1.2 mg/dL (ref 0.3–1.2)
Total Protein: 7 g/dL (ref 6.5–8.1)

## 2020-11-06 LAB — URINALYSIS, ROUTINE W REFLEX MICROSCOPIC
Bilirubin Urine: NEGATIVE
Glucose, UA: NEGATIVE mg/dL
Hgb urine dipstick: NEGATIVE
Ketones, ur: NEGATIVE mg/dL
Leukocytes,Ua: NEGATIVE
Nitrite: NEGATIVE
Protein, ur: NEGATIVE mg/dL
Specific Gravity, Urine: 1.014 (ref 1.005–1.030)
pH: 5 (ref 5.0–8.0)

## 2020-11-06 LAB — TSH: TSH: 3.518 u[IU]/mL (ref 0.350–4.500)

## 2020-11-06 LAB — ECHOCARDIOGRAM COMPLETE
AV Mean grad: 3.3 mmHg
AV Peak grad: 5.4 mmHg
Ao pk vel: 1.16 m/s
Area-P 1/2: 6.37 cm2
Height: 71 in
S' Lateral: 2.3 cm
Weight: 4483.27 oz

## 2020-11-06 LAB — MRSA NEXT GEN BY PCR, NASAL: MRSA by PCR Next Gen: NOT DETECTED

## 2020-11-06 LAB — HIV ANTIBODY (ROUTINE TESTING W REFLEX): HIV Screen 4th Generation wRfx: NONREACTIVE

## 2020-11-06 LAB — MAGNESIUM: Magnesium: 1.9 mg/dL (ref 1.7–2.4)

## 2020-11-06 MED ORDER — PERFLUTREN LIPID MICROSPHERE
1.0000 mL | INTRAVENOUS | Status: AC | PRN
Start: 2020-11-06 — End: 2020-11-06
  Administered 2020-11-06: 3 mL via INTRAVENOUS
  Filled 2020-11-06: qty 10

## 2020-11-06 MED ORDER — CHLORHEXIDINE GLUCONATE CLOTH 2 % EX PADS
6.0000 | MEDICATED_PAD | Freq: Every day | CUTANEOUS | Status: DC
  Administered 2020-11-06 – 2020-11-07 (×2): 6 via TOPICAL

## 2020-11-06 NOTE — Consult Note (Addendum)
Cardiology Consultation:   Patient ID: Tahjay Leeman MRN: [id number]; DOB: July 13, 1965  Admit date: 11/05/2020 Date of Consult: 11/06/2020  PCP:  Adaline Sill, NP   [place] Providers Cardiologist: New to [facility] - Dr. Domenic Polite  Patient Profile:   Authur Laughead is a 55 y.o. male with a past medical history of HTN and GERD who is being seen 11/06/2020 for the evaluation of atrial flutter with RVR at the request of Dr. Clearence Ped.  History of Present Illness:   Mr. Stolarz presented to [facility] ED on 11/05/2020 for evaluation of worsening fatigue and had been evaluated by his PCP earlier in the day and noted to have an elevated HR. He reports worsening fatigue and dyspnea on exertion for the past month. He is physically active at baseline as he works as a Equities trader and he noticed symptoms mostly with physical activity. Reports associated presyncope but no actual syncopal episodes. No specific chest pain or palpitations. No recent orthopnea or PND.  He is unaware of any personal history of cardiac arrhythmias, CAD or CHF. No tobacco use, alcohol use or recreational drug use. He does consume approximately 3-4 caffeinated sodas a day but no tea or coffee.  Reports his daughter had what sounds most consistent with an SVT ablation when she was a teenager.  Initial labs show WBC 9.5, Hgb 15.9, platelets 157, Na+ 137, K+ 3.6 and creatinine 1.51( (at 1.34 last year). Mg 1.9. TSH 3.067. Initial Hs Troponin 6 with repeat of 5. COVID negative. CXR showing mild cardiomegaly with no acute findings. EKG shows atrial flutter with 2:1 block, HR 138 with RAD.  He was started on IV Cardizem at the time of admission along with Eliquis 5mg  BID for anticoagulation. Rates were in the 130's overnight until around 0300 and have now been in the low-100's to 110's.    Past Medical History:  Diagnosis Date   Hernia    Hypertension    Kidney stones     Past Surgical History:  Procedure  Laterality Date   HAND SURGERY Left    HERNIA REPAIR     KNEE SURGERY       Home Medications:  Prior to Admission medications   Medication Sig Start Date End Date Taking? Authorizing Provider  ibuprofen (ADVIL) 400 MG tablet Take 400 mg by mouth every 6 (six) hours as needed.   Yes [provider]  lisinopril-hydrochlorothiazide (ZESTORETIC) 20-25 MG tablet Take 1 tablet by mouth daily. 03/27/19  Yes Rancour, Annie Main, MD  azithromycin (ZITHROMAX) 250 MG tablet 1 po daily Patient not taking: Reported on 11/05/2020 06/04/16   Lily Kocher, PA-C  ondansetron (ZOFRAN) 8 MG tablet Take 1 tablet (8 mg total) by mouth every 8 (eight) hours as needed. Patient not taking: Reported on 11/05/2020 09/03/13   Ripley Fraise, MD  oxyCODONE-acetaminophen (PERCOCET/ROXICET) 5-325 MG per tablet Take 1 tablet by mouth every 4 (four) hours as needed for severe pain. Patient not taking: Reported on 11/05/2020 09/03/13   Ripley Fraise, MD  ranitidine (ZANTAC) 150 MG tablet Take 150 mg by mouth 2 (two) times daily as needed. For indigestion Patient not taking: Reported on 11/05/2020    [provider]    Inpatient Medications: Scheduled Meds:  apixaban  5 mg Oral BID   Chlorhexidine Gluconate Cloth  6 each Topical Daily   lisinopril  20 mg Oral Daily   And   hydrochlorothiazide  25 mg Oral Daily   Continuous Infusions:  diltiazem (CARDIZEM) infusion 15  mg/hr (11/06/20 0700)   PRN Meds: acetaminophen **OR** acetaminophen, morphine injection, ondansetron **OR** ondansetron (ZOFRAN) IV, oxyCODONE  Allergies:   No Known Allergies  Social History:   Social History   Socioeconomic History   Marital status: Married    Spouse name: Not on file   Number of children: Not on file   Years of education: Not on file   Highest education level: Not on file  Occupational History   Not on file  Tobacco Use   Smoking status: Never   Smokeless tobacco: Never  Vaping Use   Vaping Use: Never  used  Substance and Sexual Activity   Alcohol use: No   Drug use: No   Sexual activity: Not on file  Other Topics Concern   Not on file  Social History Narrative   Not on file   Social Determinants of Health   Financial Resource Strain: Not on file  Food Insecurity: Not on file  Transportation Needs: Not on file  Physical Activity: Not on file  Stress: Not on file  Social Connections: Not on file  Intimate Partner Violence: Not on file    Family History:   Family History  Problem Relation Age of Onset   Cancer Mother    Coronary artery disease Father      ROS:  Please see the history of present illness.   All other ROS reviewed and negative.     Physical Exam/Data:   Vitals:   11/06/20 0400 11/06/20 0500 11/06/20 0600 11/06/20 0700  BP: 100/76 99/72 108/79 104/78  Pulse: (!) 116 (!) 46 82 68  Resp: (!) 21 (!) 28 (!) 21 (!) 22  Temp: 98 F (36.7 C)   98 F (36.7 C)  TempSrc: Oral   Oral  SpO2: 91% 92% 90% 93%  Weight:      Height:        Intake/Output Summary (Last 24 hours) at 11/06/2020 1004 Last data filed at 11/06/2020 0700 Gross per 24 hour  Intake 190.12 ml  Output 220 ml  Net -29.88 ml   Last 3 Weights 11/05/2020 11/05/2020 03/27/2019  Weight (lbs) 280 lb 3.3 oz 281 lb 15.5 oz 250 lb  Weight (kg) 127.1 kg 127.9 kg 113.399 kg     Body mass index is 39.08 kg/m.  General:  Well nourished, well develop male appearing in no acute distress HEENT: normal Neck: no JVD Vascular: No carotid bruits; Distal pulses 2+ bilaterally Cardiac:  normal S1, S2; Irregularly irregular Lungs:  clear to auscultation bilaterally, no wheezing, rhonchi or rales  Abd: soft, nontender, no hepatomegaly  Ext: no pitting edema Musculoskeletal:  No deformities, BUE and BLE strength normal and equal Skin: warm and dry  Neuro:  CNs 2-12 intact, no focal abnormalities noted Psych:  Normal affect   EKG:  The EKG was personally reviewed and demonstrates: Atrial flutter with 2:1  block, HR 138 with RAD.  Telemetry:  Telemetry was personally reviewed and demonstrates: Atrial flutter with RVR, HR in 130's. Rates improved this AM in the low-100's to 110's.   Relevant CV Studies:  Echocardiogram: Pending  Laboratory Data:  High Sensitivity Troponin:   Recent Labs  Lab 11/05/20 1809 11/05/20 2008  TROPONINIHS 6 5     Chemistry Recent Labs  Lab 11/05/20 1809 11/05/20 1824 11/06/20 0429  NA 137  --  137  K 3.6  --  3.7  CL 99  --  102  CO2 29  --  28  GLUCOSE 111*  --  107*  BUN 25*  --  23*  CREATININE 1.51*  --  1.41*  CALCIUM 9.1  --  8.8*  MG  --  1.9 1.9  GFRNONAA 54*  --  58*  ANIONGAP 9  --  7    Recent Labs  Lab 11/06/20 0429  PROT 7.0  ALBUMIN 3.6  AST 31  ALT 39  ALKPHOS 62  BILITOT 1.2   Lipids No results for input(s): CHOL, TRIG, HDL, LABVLDL, LDLCALC, CHOLHDL in the last 168 hours.  Hematology Recent Labs  Lab 11/05/20 1809 11/06/20 0429  WBC 9.5 9.4  RBC 4.87 4.66  HGB 15.9 15.3  HCT 49.2 46.1  MCV 101.0* 98.9  MCH 32.6 32.8  MCHC 32.3 33.2  RDW 12.9 12.9  PLT 157 150   Thyroid  Recent Labs  Lab 11/06/20 0429  TSH 3.518    BNPNo results for input(s): BNP, PROBNP in the last 168 hours.  DDimer No results for input(s): DDIMER in the last 168 hours.   Radiology/Studies:  DG Chest Port 1 View  Result Date: 11/05/2020 CLINICAL DATA:  Abnormal EKG. Rapid heart rate chest pain and shortness of breath. EXAM: PORTABLE CHEST 1 VIEW COMPARISON:  Chest radiograph 06/03/2016 FINDINGS: Mildly enlarged cardiopericardial silhouette, may be accentuated by portable AP technique. Stable mediastinal contours. No pulmonary edema, focal airspace disease, pleural effusion or pneumothorax. IMPRESSION: Mildly enlarged cardiopericardial silhouette, may be accentuated by portable AP technique. Electronically Signed   By: Keith Rake M.D.   On: 11/05/2020 18:20     Assessment and Plan:   1. Atrial Flutter with RVR - He reports  worsening fatigue, dyspnea on exertion and episodes of presyncope over the past month and suspect he has been in the arrhythmia during this timeframe. Denies any associated chest pain or palpitations. - Electrolytes and TSH are within normal range. An echocardiogram is pending to assess for any structural abnormalities. He would benefit from a sleep study as an outpatient.  - Will continue with IV Cardizem along with Eliquis 5 mg twice daily for anticoagulation. If EF is reduced, would plan to switch to beta-blocker therapy. Unless he converts with medical therapy alone, would anticipate TEE/DCCV tomorrow. This was reviewed with the patient and he is in agreement to proceed. Keep NPO after midnight.   2. HTN - SBP has been in the low-100's since being on IV Cardizem. Will hold PTA Lisinopril-HCTZ for now to allow for titration of AV nodal blocking agents.   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 Previously at 1.2 - 1.3 so suspect this is close to his baseline.   Risk Assessment/Risk Scores:     CHA2DS2-VASc Score = 1   This indicates a 0.6% annual risk of stroke. The patient's score is based upon: CHF History: 0 HTN History: 1 Diabetes History: 0 Stroke History: 0 Vascular Disease History: 0 Age Score: 0 Gender Score: 0      For questions or updates, please contact [place] Please consult [email or web address] for contact info under    Signed, Erma Heritage, PA-C  11/06/2020 10:04 AM   Attending note:  Patient seen and examined.  I reviewed his records and discussed the case with Ms. Delano Metz, I agree with her above findings.  Mr. Frickey presents to the hospital with newly documented atrial flutter with RVR.Marland Kitchen  He has experienced fatigue and shortness of breath over the last month, was seen by his PCP for these symptoms  and sent to the ER due to finding of tachycardia.  He is currently admitted to the hospitalist team, was started on  Eliquis for stroke prophylaxis with CHA2DS2-VASc score of 1 and possibility of cardioversion.  He is also on diltiazem drip at 15 mg/h with improvement in heart rate.  He has a history of hypertension at baseline.  Also obesity and suspected OSA.  On examination he appears comfortable.  He is afebrile.  Heart rate 100-110 in atrial flutter with variable conduction.  Systolic blood pressure 123456.  Lungs are clear.  Cardiac exam reveals irregularly irregular rhythm.  No peripheral edema.  Pertinent lab work includes potassium 3.7, BUN 23, creatinine 1.41, AST 31, ALT 39, hemoglobin 15.3, platelets 150, TSH 3.52, high-sensitivity troponin I levels of 5 and 6, COVID-19 negative.  Chest x-ray reports enlarged cardiac silhouette.  I personally reviewed his ECG from November 1 showing typical atrial flutter with 2:1 block.  Newly documented persistent typical atrial flutter presenting with RVR in 2:1 block, now better heart rate control and variable conduction on intravenous diltiazem.  CHA2DS2-VASc score is 1.  Has hypertension at baseline, also suspect OSA.  He has been started on Eliquis for stroke prophylaxis, continue IV diltiazem for heart rate control.  We have discussed risks and benefits of proceeding with a TEE guided cardioversion tomorrow and he is in agreement to proceed.  Follow-up echocardiogram to assess LVEF and exclude associated cardiomyopathy.  Anticipate work-up for OSA as an outpatient.  Satira Sark, M.D., F.A.C.C.

## 2020-11-06 NOTE — Progress Notes (Signed)
*  PRELIMINARY RESULTS* Echocardiogram 2D Echocardiogram has been performed.  Mitchell Nichols 11/06/2020, 12:02 PM

## 2020-11-06 NOTE — Progress Notes (Signed)
PROGRESS NOTE  Kurtis Cord [id number] DOB: 1965/06/30 DOA: 11/05/2020 PCP: Adaline Sill, NP  Brief History:  55 year old male with a history of hypertension and nephrolithiasis presenting with approximately 3 to 4-week history of shortness of breath and dyspnea on exertion.  He has felt fatigued and lightheadedness with presyncopal type symptoms.  However he denies any chest pain, syncope, fevers, chills.  He has not had any nausea, vomiting, diarrhea, abdominal pain.  There is no dysuria or hematuria.  Because of his shortness of breath, he presented to his primary care physician on 11/05/2020.  The patient was noted to be tachycardic.  As result, he was sent to the emergency department for further evaluation.  He is physically active at baseline as he works as a Equities trader and he noticed symptoms mostly with physical activity.   He denies any alcohol or tobacco use.  In the emergency department, the patient was afebrile hemodynamically stable.  EKG showed atrial flutter with HR in 130s.  He was started on IV diltiazem and apixaban.  Cardiology was consulted to assist with management.  Assessment/Plan: Atrial Flutter with RVR -appreciate cardiology consult--plans noted for possible DCCV 11/3 -CHADS-VASc = 1 -Continue apixaban -TSH 3.067 -Continue IV diltiazem -11/06/2020 echo EF 50 to 55%, no WMA, low normal RV, trivial MR  Essential hypertension -Continue IV diltiazem -Holding lisinopril HCTZ to allow blood pressure margin for titration  CKD stage IIIa -Baseline creatinine 1.2-1.5  Class II obesity -BMI 39.08 -Lifestyle modification      Status is: Inpatient  Remains inpatient appropriate because: atrial flutter requiring diltiazem drip for rate control and possible DCCV        Family Communication:   no Family at bedside  Consultants:  cardiology  Code Status:  FULL   DVT Prophylaxis:  apixaban   Procedures: As Listed in  Progress Note Above  Antibiotics: None      Subjective: Patient denies fevers, chills, headache, chest pain, dyspnea, nausea, vomiting, diarrhea, abdominal pain, dysuria, hematuria, hematochezia, and melena.   Objective: Vitals:   11/06/20 0700 11/06/20 1000 11/06/20 1100 11/06/20 1200  BP: 104/78 110/88 111/75 120/77  Pulse: 68 (!) 137 (!) 54 73  Resp: (!) 22 (!) 22 (!) 29 16  Temp: 98 F (36.7 C)   98.6 F (37 C)  TempSrc: Oral     SpO2: 93% 93% 93% 93%  Weight:      Height:        Intake/Output Summary (Last 24 hours) at 11/06/2020 1259 Last data filed at 11/06/2020 0700 Gross per 24 hour  Intake 190.12 ml  Output 220 ml  Net -29.88 ml   Weight change:  Exam:  General:  Pt is alert, follows commands appropriately, not in acute distress HEENT: No icterus, No thrush, No neck mass, [place]/AT Cardiovascular: IRRR, S1/S2, no rubs, no gallops Respiratory: CTA bilaterally, no wheezing, no crackles, no rhonchi Abdomen: Soft/+BS, non tender, non distended, no guarding Extremities: No edema, No lymphangitis, No petechiae, No rashes, no synovitis   Data Reviewed: I have personally reviewed following labs and imaging studies Basic Metabolic Panel: Recent Labs  Lab 11/05/20 1809 11/05/20 1824 11/06/20 0429  NA 137  --  137  K 3.6  --  3.7  CL 99  --  102  CO2 29  --  28  GLUCOSE 111*  --  107*  BUN 25*  --  23*  CREATININE 1.51*  --  1.41*  CALCIUM 9.1  --  8.8*  MG  --  1.9 1.9   Liver Function Tests: Recent Labs  Lab 11/06/20 0429  AST 31  ALT 39  ALKPHOS 62  BILITOT 1.2  PROT 7.0  ALBUMIN 3.6   No results for input(s): LIPASE, AMYLASE in the last 168 hours. No results for input(s): AMMONIA in the last 168 hours. Coagulation Profile: Recent Labs  Lab 11/05/20 1824  INR 1.0   CBC: Recent Labs  Lab 11/05/20 1809 11/06/20 0429  WBC 9.5 9.4  NEUTROABS  --  5.7  HGB 15.9 15.3  HCT 49.2 46.1  MCV 101.0* 98.9  PLT 157 150   Cardiac  Enzymes: No results for input(s): CKTOTAL, CKMB, CKMBINDEX, TROPONINI in the last 168 hours. BNP: Invalid input(s): POCBNP CBG: No results for input(s): GLUCAP in the last 168 hours. HbA1C: No results for input(s): HGBA1C in the last 72 hours. Urine analysis:    Component Value Date/Time   COLORURINE YELLOW 11/05/2020 0005   APPEARANCEUR CLEAR 11/05/2020 0005   LABSPEC 1.014 11/05/2020 0005   PHURINE 5.0 11/05/2020 0005   GLUCOSEU NEGATIVE 11/05/2020 0005   HGBUR NEGATIVE 11/05/2020 0005   BILIRUBINUR NEGATIVE 11/05/2020 0005   KETONESUR NEGATIVE 11/05/2020 0005   PROTEINUR NEGATIVE 11/05/2020 0005   UROBILINOGEN 0.2 09/03/2013 2145   NITRITE NEGATIVE 11/05/2020 0005   LEUKOCYTESUR NEGATIVE 11/05/2020 0005   Sepsis Labs: @LABRCNTIP (procalcitonin:4,lacticidven:4) ) Recent Results (from the past 240 hour(s))  Resp Panel by RT-PCR (Flu A&B, Covid) Nasopharyngeal Swab     Status: None   Collection Time: 11/05/20  8:21 PM   Specimen: Nasopharyngeal Swab; Nasopharyngeal(NP) swabs in vial transport medium  Result Value Ref Range Status   SARS Coronavirus 2 by RT PCR NEGATIVE NEGATIVE Final    Comment: (NOTE) SARS-CoV-2 target nucleic acids are NOT DETECTED.  The SARS-CoV-2 RNA is generally detectable in upper respiratory specimens during the acute phase of infection. The lowest concentration of SARS-CoV-2 viral copies this assay can detect is 138 copies/mL. A negative result does not preclude SARS-Cov-2 infection and should not be used as the sole basis for treatment or other patient management decisions. A negative result may occur with  improper specimen collection/handling, submission of specimen other than nasopharyngeal swab, presence of viral mutation(s) within the areas targeted by this assay, and inadequate number of viral copies(<138 copies/mL). A negative result must be combined with clinical observations, patient history, and epidemiological information. The  expected result is Negative.  Fact Sheet for Patients:  EntrepreneurPulse.com.au  Fact Sheet for Healthcare Providers:  IncredibleEmployment.be  This test is no t yet approved or cleared by the [place] FDA and  has been authorized for detection and/or diagnosis of SARS-CoV-2 by FDA under an Emergency Use Authorization (EUA). This EUA will remain  in effect (meaning this test can be used) for the duration of the COVID-19 declaration under Section 564(b)(1) of the Act, 21 U.S.C.section 360bbb-3(b)(1), unless the authorization is terminated  or revoked sooner.       Influenza A by PCR NEGATIVE NEGATIVE Final   Influenza B by PCR NEGATIVE NEGATIVE Final    Comment: (NOTE) The Xpert Xpress SARS-CoV-2/FLU/RSV plus assay is intended as an aid in the diagnosis of influenza from Nasopharyngeal swab specimens and should not be used as a sole basis for treatment. Nasal washings and aspirates are unacceptable for Xpert Xpress SARS-CoV-2/FLU/RSV testing.  Fact Sheet for Patients: EntrepreneurPulse.com.au  Fact Sheet for Healthcare Providers: IncredibleEmployment.be  This test is not yet approved  or cleared by the [place] and has been authorized for detection and/or diagnosis of SARS-CoV-2 by FDA under an Emergency Use Authorization (EUA). This EUA will remain in effect (meaning this test can be used) for the duration of the COVID-19 declaration under Section 564(b)(1) of the Act, 21 U.S.C. section 360bbb-3(b)(1), unless the authorization is terminated or revoked.  Performed at [facility], [address] [place], [place] [address]   MRSA Next Gen by PCR, Nasal     Status: None   Collection Time: 11/05/20 10:32 PM   Specimen: Nasal Mucosa; Nasal Swab  Result Value Ref Range Status   MRSA by PCR Next Gen NOT DETECTED NOT DETECTED Final    Comment: (NOTE) The GeneXpert MRSA Assay (FDA approved for  NASAL specimens only), is one component of a comprehensive MRSA colonization surveillance program. It is not intended to diagnose MRSA infection nor to guide or monitor treatment for MRSA infections. Test performance is not FDA approved in patients less than 15 years old. Performed at [facility], [address], [place], [place] [address]      Scheduled Meds:  apixaban  5 mg Oral BID   Chlorhexidine Gluconate Cloth  6 each Topical Daily   Continuous Infusions:  diltiazem (CARDIZEM) infusion 15 mg/hr (11/06/20 1249)    Procedures/Studies: DG Chest Port 1 View  Result Date: 11/05/2020 CLINICAL DATA:  Abnormal EKG. Rapid heart rate chest pain and shortness of breath. EXAM: PORTABLE CHEST 1 VIEW COMPARISON:  Chest radiograph 06/03/2016 FINDINGS: Mildly enlarged cardiopericardial silhouette, may be accentuated by portable AP technique. Stable mediastinal contours. No pulmonary edema, focal airspace disease, pleural effusion or pneumothorax. IMPRESSION: Mildly enlarged cardiopericardial silhouette, may be accentuated by portable AP technique. Electronically Signed   By: Keith Rake M.D.   On: 11/05/2020 18:20   ECHOCARDIOGRAM COMPLETE  Result Date: 11/06/2020    ECHOCARDIOGRAM REPORT   Patient Name:   XZAVIER NEITZKE Date of Exam: 11/06/2020 Medical Rec #:  [id number]      Height:       71.0 in Accession #:    [id number]     Weight:       280.2 lb Date of Birth:  1965-11-12     BSA:          2.433 m Patient Age:    31 years       BP:           104/78 mmHg Patient Gender: M              HR:           96 bpm. Exam Location:  [facility] Procedure: 2D Echo, Cardiac Doppler, Color Doppler and Intracardiac            Opacification Agent Indications:    Atrial flutter  History:        Patient has no prior history of Echocardiogram examinations.                 Arrythmias:Atrial Flutter; Risk Factors:Hypertension.  Sonographer:    Wenda Low Referring Phys: [id number] ASIA B [place]   Sonographer Comments: Technically difficult study due to poor echo windows and patient is morbidly obese. IMPRESSIONS  1. Left ventricular ejection fraction, by estimation, is 50 to 55%. The left ventricle has low normal function. The left ventricle has no regional wall motion abnormalities. There is moderate left ventricular hypertrophy. Left ventricular diastolic parameters are indeterminate in the setting of atrial flutter.  2. Right ventricular  systolic function is low normal. The right ventricular size is normal. Tricuspid regurgitation signal is inadequate for assessing PA pressure.  3. The mitral valve is grossly normal. Trivial mitral valve regurgitation.  4. The aortic valve is tricuspid. Aortic valve regurgitation is not visualized. No aortic stenosis is present. Aortic valve mean gradient measures 3.3 mmHg.  5. The inferior vena cava is normal in size with greater than 50% respiratory variability, suggesting right atrial pressure of 3 mmHg. Comparison(s): No prior Echocardiogram. FINDINGS  Left Ventricle: Left ventricular ejection fraction, by estimation, is 50 to 55%. The left ventricle has low normal function. The left ventricle has no regional wall motion abnormalities. Definity contrast agent was given IV to delineate the left ventricular endocardial borders. The left ventricular internal cavity size was normal in size. There is moderate left ventricular hypertrophy. Left ventricular diastolic parameters are indeterminate. Right Ventricle: The right ventricular size is normal. No increase in right ventricular wall thickness. Right ventricular systolic function is low normal. Tricuspid regurgitation signal is inadequate for assessing PA pressure. Left Atrium: Left atrial size was normal in size. Right Atrium: Right atrial size was normal in size. Pericardium: There is no evidence of pericardial effusion. Presence of pericardial fat pad. Mitral Valve: The mitral valve is grossly normal. Trivial mitral  valve regurgitation. MV peak gradient, 3.1 mmHg. The mean mitral valve gradient is 1.0 mmHg. Tricuspid Valve: The tricuspid valve is grossly normal. Tricuspid valve regurgitation is mild. Aortic Valve: The aortic valve is tricuspid. There is mild aortic valve annular calcification. Aortic valve regurgitation is not visualized. No aortic stenosis is present. Aortic valve mean gradient measures 3.3 mmHg. Aortic valve peak gradient measures 5.4 mmHg. Pulmonic Valve: The pulmonic valve was grossly normal. Pulmonic valve regurgitation is trivial. Aorta: The aortic root is normal in size and structure. Venous: The inferior vena cava is normal in size with greater than 50% respiratory variability, suggesting right atrial pressure of 3 mmHg. IAS/Shunts: No atrial level shunt detected by color flow Doppler.  LEFT VENTRICLE PLAX 2D LVIDd:         4.50 cm Diastology LVIDs:         2.30 cm LV e' medial:    8.38 cm/s LV PW:         1.30 cm LV E/e' medial:  11.2 LV IVS:        1.50 cm LV e' lateral:   14.80 cm/s                        LV E/e' lateral: 6.3  RIGHT VENTRICLE RV Basal diam:  3.10 cm LEFT ATRIUM             Index        RIGHT ATRIUM           Index LA diam:        3.90 cm 1.60 cm/m   RA Area:     15.20 cm LA Vol (A2C):   45.9 ml 18.86 ml/m  RA Volume:   40.80 ml  16.77 ml/m LA Vol (A4C):   51.2 ml 21.04 ml/m LA Biplane Vol: 49.1 ml 20.18 ml/m  AORTIC VALVE                   PULMONIC VALVE AV Vmax:           116.00 cm/s PV Vmax:       0.75 m/s AV Vmean:  85.333 cm/s PV Peak grad:  2.3 mmHg AV VTI:            0.217 m AV Peak Grad:      5.4 mmHg AV Mean Grad:      3.3 mmHg LVOT Vmax:         86.80 cm/s LVOT Vmean:        60.200 cm/s LVOT VTI:          0.147 m LVOT/AV VTI ratio: 0.68 MITRAL VALVE MV Area (PHT): 6.37 cm    SHUNTS MV Peak grad:  3.1 mmHg    Systemic VTI: 0.15 m MV Mean grad:  1.0 mmHg MV Vmax:       0.88 m/s MV Vmean:      40.8 cm/s MV Decel Time: 119 msec MV E velocity: 93.80 cm/s [name] MD Electronically signed by [name] MD Signature Date/Time: 11/06/2020/12:13:20 PM    Final     Catarina Hartshorn, DO  [facility]  If 7PM-7AM, please contact night-coverage [email or web address] Password [email or web address] 11/06/2020, 12:59 PM   LOS: 0 days

## 2020-11-07 ENCOUNTER — Inpatient Hospital Stay (HOSPITAL_COMMUNITY): Payer: 59 | Admitting: Anesthesiology

## 2020-11-07 ENCOUNTER — Encounter (HOSPITAL_COMMUNITY): Payer: Self-pay | Admitting: Internal Medicine

## 2020-11-07 ENCOUNTER — Inpatient Hospital Stay (HOSPITAL_COMMUNITY): Payer: 59

## 2020-11-07 ENCOUNTER — Encounter (HOSPITAL_COMMUNITY)
Admission: EM | Disposition: A | Discharge status: Home / Self Care | Payer: Self-pay | Source: Home / Self Care | Attending: Internal Medicine

## 2020-11-07 DIAGNOSIS — I4891 Unspecified atrial fibrillation: Secondary | ICD-10-CM

## 2020-11-07 DIAGNOSIS — I340 Nonrheumatic mitral (valve) insufficiency: Secondary | ICD-10-CM

## 2020-11-07 DIAGNOSIS — I4892 Unspecified atrial flutter: Secondary | ICD-10-CM

## 2020-11-07 HISTORY — PX: TEE WITHOUT CARDIOVERSION: SHX5443

## 2020-11-07 HISTORY — PX: CARDIOVERSION: SHX1299

## 2020-11-07 LAB — MAGNESIUM: Magnesium: 2 mg/dL (ref 1.7–2.4)

## 2020-11-07 LAB — BASIC METABOLIC PANEL
Anion gap: 10 (ref 5–15)
BUN: 25 mg/dL — ABNORMAL HIGH (ref 6–20)
CO2: 27 mmol/L (ref 22–32)
Calcium: 8.9 mg/dL (ref 8.9–10.3)
Chloride: 100 mmol/L (ref 98–111)
Creatinine, Ser: 1.4 mg/dL — ABNORMAL HIGH (ref 0.61–1.24)
GFR, Estimated: 59 mL/min — ABNORMAL LOW (ref >60)
Glucose, Bld: 118 mg/dL — ABNORMAL HIGH (ref 70–99)
Potassium: 3.9 mmol/L (ref 3.5–5.1)
Sodium: 137 mmol/L (ref 135–145)

## 2020-11-07 SURGERY — ECHOCARDIOGRAM, TRANSESOPHAGEAL
Anesthesia: General

## 2020-11-07 MED ORDER — PROPOFOL 500 MG/50ML IV EMUL
INTRAVENOUS | Status: DC | PRN
  Administered 2020-11-07: 150 ug/kg/min via INTRAVENOUS

## 2020-11-07 MED ORDER — LACTATED RINGERS IV SOLN: INTRAVENOUS | Status: DC | PRN

## 2020-11-07 MED ORDER — PROPOFOL 10 MG/ML IV BOLUS
INTRAVENOUS | Status: AC
  Filled 2020-11-07: qty 60

## 2020-11-07 MED ORDER — LIDOCAINE VISCOUS HCL 2 % MT SOLN
OROMUCOSAL | Status: AC
  Filled 2020-11-07: qty 15

## 2020-11-07 MED ORDER — ORAL CARE MOUTH RINSE
15.0000 mL | Freq: Two times a day (BID) | OROMUCOSAL | Status: DC
  Administered 2020-11-07: 15 mL via OROMUCOSAL

## 2020-11-07 MED ORDER — METOPROLOL SUCCINATE ER 50 MG PO TB24: 50.0000 mg | ORAL_TABLET | Freq: Every day | ORAL | 1 refills | Status: DC

## 2020-11-07 MED ORDER — METOPROLOL SUCCINATE ER 50 MG PO TB24
50.0000 mg | ORAL_TABLET | Freq: Every day | ORAL | Status: DC
  Administered 2020-11-07: 50 mg via ORAL
  Filled 2020-11-07 (×3): qty 1

## 2020-11-07 MED ORDER — MIDAZOLAM HCL 5 MG/5ML IJ SOLN
INTRAMUSCULAR | Status: DC | PRN
  Administered 2020-11-07: 2 mg via INTRAVENOUS

## 2020-11-07 MED ORDER — PROPOFOL 10 MG/ML IV BOLUS
INTRAVENOUS | Status: DC | PRN
  Administered 2020-11-07: 50 mg via INTRAVENOUS

## 2020-11-07 MED ORDER — MIDAZOLAM HCL 2 MG/2ML IJ SOLN
INTRAMUSCULAR | Status: AC
  Filled 2020-11-07: qty 2

## 2020-11-07 MED ORDER — APIXABAN 5 MG PO TABS
5.0000 mg | ORAL_TABLET | Freq: Two times a day (BID) | ORAL | 1 refills | Status: DC
Start: 2020-11-07 — End: 2021-01-07

## 2020-11-07 NOTE — Addendum Note (Signed)
Addendum  created 11/07/20 1427 by Moshe Salisbury, CRNA   Charge Capture section accepted

## 2020-11-07 NOTE — Discharge Summary (Signed)
Physician Discharge Summary  Mitchell Nichols [id number] DOB: 1965/11/12 DOA: 11/05/2020  PCP: Adaline Sill, NP  Admit date: 11/05/2020 Discharge date: 11/07/2020  Admitted From: Home Disposition:  Home   Recommendations for Outpatient Follow-up:  Follow up with PCP in 1-2 weeks Please obtain BMP/CBC in one week     Discharge Condition: Stable CODE STATUS: FULL Diet recommendation: Heart Healthy   Brief/Interim Summary: 55 year old male with a history of hypertension and nephrolithiasis presenting with approximately 3 to 4-week history of shortness of breath and dyspnea on exertion.  He has felt fatigued and lightheadedness with presyncopal type symptoms.  However he denies any chest pain, syncope, fevers, chills.  He has not had any nausea, vomiting, diarrhea, abdominal pain.  There is no dysuria or hematuria.  Because of his shortness of breath, he presented to his primary care physician on 11/05/2020.  The patient was noted to be tachycardic.  As result, he was sent to the emergency department for further evaluation.  He is physically active at baseline as he works as a Equities trader and he noticed symptoms mostly with physical activity.   He denies any alcohol or tobacco use.   In the emergency department, the patient was afebrile hemodynamically stable.  EKG showed atrial flutter with HR in 130s.  He was started on IV diltiazem and apixaban.  Cardiology was consulted to assist with management.    Discharge Diagnoses:  Atrial Flutter with RVR -appreciate cardiology consult -CHADS-VASc = 1 -Continue apixaban -TSH 3.067 -Continue IV diltiazem -11/06/2020 echo EF 50 to 55%, no WMA, low normal RV, trivial MR -11/07/20--DCCV>>>sinus -monitored for several hours after DCCV>>remained in sinus with ambulation   Essential hypertension -Continue IV diltiazem>>transitioned to metoprolol succinate -Holding lisinopril HCTZ to allow blood pressure margin for  titration>>>will not restart   CKD stage IIIa -Baseline creatinine 1.2-1.5 -serum creatinine 1.40 on day of d/c   Class II obesity -BMI 39.08 -Lifestyle modification  Nocturnal oxygen desaturation -will need outpatient polysomnogram  Discharge Instructions   Allergies as of 11/07/2020   No Known Allergies      Medication List     STOP taking these medications    azithromycin 250 MG tablet Commonly known as: Zithromax   ibuprofen 400 MG tablet Commonly known as: ADVIL   lisinopril-hydrochlorothiazide 20-25 MG tablet Commonly known as: ZESTORETIC   ondansetron 8 MG tablet Commonly known as: Zofran   oxyCODONE-acetaminophen 5-325 MG tablet Commonly known as: PERCOCET/ROXICET   ranitidine 150 MG tablet Commonly known as: ZANTAC       TAKE these medications    apixaban 5 MG Tabs tablet Commonly known as: ELIQUIS Take 1 tablet (5 mg total) by mouth 2 (two) times daily.   metoprolol succinate 50 MG 24 hr tablet Commonly known as: TOPROL-XL Take 1 tablet (50 mg total) by mouth daily. Take with or immediately following a meal. Start taking on: November 08, 2020        Follow-up Information     Theora Gianotti, NP Follow up on 11/25/2020.   Specialties: Nurse Practitioner, Cardiology, Radiology Why: Cardiology Hospital Follow-up on 11/25/2020 at 3:05 PM. Contact information: [address] [place] [address] [phone]                No Known Allergies  Consultations: cardiology   Procedures/Studies: DG Chest Port 1 View  Result Date: 11/05/2020 CLINICAL DATA:  Abnormal EKG. Rapid heart rate chest pain and shortness of breath. EXAM: PORTABLE CHEST 1 VIEW COMPARISON:  Chest radiograph 06/03/2016 FINDINGS: Mildly enlarged cardiopericardial silhouette, may be accentuated by portable AP technique. Stable mediastinal contours. No pulmonary edema, focal airspace disease, pleural effusion or pneumothorax. IMPRESSION: Mildly enlarged  cardiopericardial silhouette, may be accentuated by portable AP technique. Electronically Signed   By: Keith Rake M.D.   On: 11/05/2020 18:20   ECHOCARDIOGRAM COMPLETE  Result Date: 11/06/2020    ECHOCARDIOGRAM REPORT   Patient Name:   Mitchell Nichols Date of Exam: 11/06/2020 Medical Rec #:  [id number]      Height:       71.0 in Accession #:    [id number]     Weight:       280.2 lb Date of Birth:  July 12, 1965     BSA:          2.433 m Patient Age:    89 years       BP:           104/78 mmHg Patient Gender: M              HR:           96 bpm. Exam Location:  [facility] Procedure: 2D Echo, Cardiac Doppler, Color Doppler and Intracardiac            Opacification Agent Indications:    Atrial flutter  History:        Patient has no prior history of Echocardiogram examinations.                 Arrythmias:Atrial Flutter; Risk Factors:Hypertension.  Sonographer:    Wenda Low Referring Phys: [id number] ASIA B [place]  Sonographer Comments: Technically difficult study due to poor echo windows and patient is morbidly obese. IMPRESSIONS  1. Left ventricular ejection fraction, by estimation, is 50 to 55%. The left ventricle has low normal function. The left ventricle has no regional wall motion abnormalities. There is moderate left ventricular hypertrophy. Left ventricular diastolic parameters are indeterminate in the setting of atrial flutter.  2. Right ventricular systolic function is low normal. The right ventricular size is normal. Tricuspid regurgitation signal is inadequate for assessing PA pressure.  3. The mitral valve is grossly normal. Trivial mitral valve regurgitation.  4. The aortic valve is tricuspid. Aortic valve regurgitation is not visualized. No aortic stenosis is present. Aortic valve mean gradient measures 3.3 mmHg.  5. The inferior vena cava is normal in size with greater than 50% respiratory variability, suggesting right atrial pressure of 3 mmHg. Comparison(s): No prior Echocardiogram.  FINDINGS  Left Ventricle: Left ventricular ejection fraction, by estimation, is 50 to 55%. The left ventricle has low normal function. The left ventricle has no regional wall motion abnormalities. Definity contrast agent was given IV to delineate the left ventricular endocardial borders. The left ventricular internal cavity size was normal in size. There is moderate left ventricular hypertrophy. Left ventricular diastolic parameters are indeterminate. Right Ventricle: The right ventricular size is normal. No increase in right ventricular wall thickness. Right ventricular systolic function is low normal. Tricuspid regurgitation signal is inadequate for assessing PA pressure. Left Atrium: Left atrial size was normal in size. Right Atrium: Right atrial size was normal in size. Pericardium: There is no evidence of pericardial effusion. Presence of pericardial fat pad. Mitral Valve: The mitral valve is grossly normal. Trivial mitral valve regurgitation. MV peak gradient, 3.1 mmHg. The mean mitral valve gradient is 1.0 mmHg. Tricuspid Valve: The tricuspid valve is grossly normal. Tricuspid valve regurgitation is mild. Aortic Valve: The aortic valve is  tricuspid. There is mild aortic valve annular calcification. Aortic valve regurgitation is not visualized. No aortic stenosis is present. Aortic valve mean gradient measures 3.3 mmHg. Aortic valve peak gradient measures 5.4 mmHg. Pulmonic Valve: The pulmonic valve was grossly normal. Pulmonic valve regurgitation is trivial. Aorta: The aortic root is normal in size and structure. Venous: The inferior vena cava is normal in size with greater than 50% respiratory variability, suggesting right atrial pressure of 3 mmHg. IAS/Shunts: No atrial level shunt detected by color flow Doppler.  LEFT VENTRICLE PLAX 2D LVIDd:         4.50 cm Diastology LVIDs:         2.30 cm LV e' medial:    8.38 cm/s LV PW:         1.30 cm LV E/e' medial:  11.2 LV IVS:        1.50 cm LV e' lateral:    14.80 cm/s                        LV E/e' lateral: 6.3  RIGHT VENTRICLE RV Basal diam:  3.10 cm LEFT ATRIUM             Index        RIGHT ATRIUM           Index LA diam:        3.90 cm 1.60 cm/m   RA Area:     15.20 cm LA Vol (A2C):   45.9 ml 18.86 ml/m  RA Volume:   40.80 ml  16.77 ml/m LA Vol (A4C):   51.2 ml 21.04 ml/m LA Biplane Vol: 49.1 ml 20.18 ml/m  AORTIC VALVE                   PULMONIC VALVE AV Vmax:           116.00 cm/s PV Vmax:       0.75 m/s AV Vmean:          85.333 cm/s PV Peak grad:  2.3 mmHg AV VTI:            0.217 m AV Peak Grad:      5.4 mmHg AV Mean Grad:      3.3 mmHg LVOT Vmax:         86.80 cm/s LVOT Vmean:        60.200 cm/s LVOT VTI:          0.147 m LVOT/AV VTI ratio: 0.68 MITRAL VALVE MV Area (PHT): 6.37 cm    SHUNTS MV Peak grad:  3.1 mmHg    Systemic VTI: 0.15 m MV Mean grad:  1.0 mmHg MV Vmax:       0.88 m/s MV Vmean:      40.8 cm/s MV Decel Time: 119 msec MV E velocity: 93.80 cm/s Rozann Lesches MD Electronically signed by Rozann Lesches MD Signature Date/Time: 11/06/2020/12:13:20 PM    Final    ECHO TEE  Result Date: 11/07/2020    TRANSESOPHOGEAL ECHO REPORT   Patient Name:   Ross Marcus Date of Exam: 11/07/2020 Medical Rec #:  [id number]      Height:       71.0 in Accession #:    [id number]     Weight:       280.2 lb Date of Birth:  Jan 08, 1965     BSA:          2.433 m Patient Age:    59 years  BP:           131/100 mmHg Patient Gender: M              HR:           128 bpm. Exam Location:  [facility] Procedure: Transesophageal Echo, Color Doppler and Cardiac Doppler Indications:    Atrial flutter I48.92  History:        Patient has prior history of Echocardiogram examinations, most                 recent 11/06/2020. Arrythmias:Atrial Flutter; Risk                 Factors:Hypertension. Morid Obesity.  Sonographer:    Alvino Chapel RCS Referring Phys: [id number] [place]: TEE procedure time was 4 minutes. The transesophogeal probe was passed  without difficulty through the esophogus of the patient. Imaged were obtained with the patient in a left lateral decubitus position. Local oropharyngeal anesthetic was provided with viscous lidocaine. Sedation performed by different physician. Image quality was adequate. The patient developed Respiratory depression during the procedure. IMPRESSIONS  1. Left ventricular ejection fraction, by estimation, is 50 to 55%. The left ventricle has low normal function.  2. Right ventricular systolic function is normal. The right ventricular size is normal.  3. No left atrial/left atrial appendage thrombus was detected. The LAA emptying velocity was 90 cm/s.  4. The mitral valve is grossly normal. Mild mitral valve regurgitation.  5. The aortic valve is tricuspid. Aortic valve regurgitation is not visualized.  6. Transgastric views not obtained, scope withdrawn due to need for respiratory support by anesthesia team. FINDINGS  Left Ventricle: Left ventricular ejection fraction, by estimation, is 50 to 55%. The left ventricle has low normal function. The left ventricular internal cavity size was normal in size. Right Ventricle: The right ventricular size is normal. Right vetricular wall thickness was not assessed. Right ventricular systolic function is normal. Left Atrium: Left atrial size was normal in size. No left atrial/left atrial appendage thrombus was detected. The LAA emptying velocity was 90 cm/s. Right Atrium: Right atrial size was normal in size. Pericardium: There is no evidence of pericardial effusion. Mitral Valve: The mitral valve is grossly normal. Mild mitral valve regurgitation. Tricuspid Valve: The tricuspid valve is grossly normal. Tricuspid valve regurgitation is trivial. Aortic Valve: The aortic valve is tricuspid. Aortic valve regurgitation is not visualized. Pulmonic Valve: The pulmonic valve was grossly normal. Pulmonic valve regurgitation is trivial. Aorta: Transgastric views not obtained, scope  withdrawn due to need for respiratory support by anesthesia team. The aortic root is normal in size and structure. IAS/Shunts: No atrial level shunt detected by color flow Doppler. Rozann Lesches MD Electronically signed by Rozann Lesches MD Signature Date/Time: 11/07/2020/12:01:44 PM    Final         Discharge Exam: Vitals:   11/07/20 1300 11/07/20 1400  BP: 113/78 107/62  Pulse: 87 79  Resp: (!) 23 20  Temp:    SpO2: 97% 94%   Vitals:   11/07/20 1140 11/07/20 1200 11/07/20 1300 11/07/20 1400  BP: 112/83 116/80 113/78 107/62  Pulse: 81 79 87 79  Resp: 20 20 (!) 23 20  Temp: 98 F (36.7 C)     TempSrc:      SpO2: 96% 95% 97% 94%  Weight:      Height:        General: Pt is alert, awake, not in acute distress Cardiovascular: RRR, S1/S2 +,  no rubs, no gallops Respiratory: CTA bilaterally, no wheezing, no rhonchi Abdominal: Soft, NT, ND, bowel sounds + Extremities: no edema, no cyanosis   The results of significant diagnostics from this hospitalization (including imaging, microbiology, ancillary and laboratory) are listed below for reference.    Significant Diagnostic Studies: DG Chest Port 1 View  Result Date: 11/05/2020 CLINICAL DATA:  Abnormal EKG. Rapid heart rate chest pain and shortness of breath. EXAM: PORTABLE CHEST 1 VIEW COMPARISON:  Chest radiograph 06/03/2016 FINDINGS: Mildly enlarged cardiopericardial silhouette, may be accentuated by portable AP technique. Stable mediastinal contours. No pulmonary edema, focal airspace disease, pleural effusion or pneumothorax. IMPRESSION: Mildly enlarged cardiopericardial silhouette, may be accentuated by portable AP technique. Electronically Signed   By: Keith Rake M.D.   On: 11/05/2020 18:20   ECHOCARDIOGRAM COMPLETE  Result Date: 11/06/2020    ECHOCARDIOGRAM REPORT   Patient Name:   Mitchell Nichols Date of Exam: 11/06/2020 Medical Rec #:  [id number]      Height:       71.0 in Accession #:    [id number]     Weight:        280.2 lb Date of Birth:  03/18/1965     BSA:          2.433 m Patient Age:    53 years       BP:           104/78 mmHg Patient Gender: M              HR:           96 bpm. Exam Location:  [facility] Procedure: 2D Echo, Cardiac Doppler, Color Doppler and Intracardiac            Opacification Agent Indications:    Atrial flutter  History:        Patient has no prior history of Echocardiogram examinations.                 Arrythmias:Atrial Flutter; Risk Factors:Hypertension.  Sonographer:    Wenda Low Referring Phys: [id number] ASIA B [place]  Sonographer Comments: Technically difficult study due to poor echo windows and patient is morbidly obese. IMPRESSIONS  1. Left ventricular ejection fraction, by estimation, is 50 to 55%. The left ventricle has low normal function. The left ventricle has no regional wall motion abnormalities. There is moderate left ventricular hypertrophy. Left ventricular diastolic parameters are indeterminate in the setting of atrial flutter.  2. Right ventricular systolic function is low normal. The right ventricular size is normal. Tricuspid regurgitation signal is inadequate for assessing PA pressure.  3. The mitral valve is grossly normal. Trivial mitral valve regurgitation.  4. The aortic valve is tricuspid. Aortic valve regurgitation is not visualized. No aortic stenosis is present. Aortic valve mean gradient measures 3.3 mmHg.  5. The inferior vena cava is normal in size with greater than 50% respiratory variability, suggesting right atrial pressure of 3 mmHg. Comparison(s): No prior Echocardiogram. FINDINGS  Left Ventricle: Left ventricular ejection fraction, by estimation, is 50 to 55%. The left ventricle has low normal function. The left ventricle has no regional wall motion abnormalities. Definity contrast agent was given IV to delineate the left ventricular endocardial borders. The left ventricular internal cavity size was normal in size. There is moderate left ventricular  hypertrophy. Left ventricular diastolic parameters are indeterminate. Right Ventricle: The right ventricular size is normal. No increase in right ventricular wall thickness. Right ventricular systolic function is low normal. Tricuspid regurgitation signal is  inadequate for assessing PA pressure. Left Atrium: Left atrial size was normal in size. Right Atrium: Right atrial size was normal in size. Pericardium: There is no evidence of pericardial effusion. Presence of pericardial fat pad. Mitral Valve: The mitral valve is grossly normal. Trivial mitral valve regurgitation. MV peak gradient, 3.1 mmHg. The mean mitral valve gradient is 1.0 mmHg. Tricuspid Valve: The tricuspid valve is grossly normal. Tricuspid valve regurgitation is mild. Aortic Valve: The aortic valve is tricuspid. There is mild aortic valve annular calcification. Aortic valve regurgitation is not visualized. No aortic stenosis is present. Aortic valve mean gradient measures 3.3 mmHg. Aortic valve peak gradient measures 5.4 mmHg. Pulmonic Valve: The pulmonic valve was grossly normal. Pulmonic valve regurgitation is trivial. Aorta: The aortic root is normal in size and structure. Venous: The inferior vena cava is normal in size with greater than 50% respiratory variability, suggesting right atrial pressure of 3 mmHg. IAS/Shunts: No atrial level shunt detected by color flow Doppler.  LEFT VENTRICLE PLAX 2D LVIDd:         4.50 cm Diastology LVIDs:         2.30 cm LV e' medial:    8.38 cm/s LV PW:         1.30 cm LV E/e' medial:  11.2 LV IVS:        1.50 cm LV e' lateral:   14.80 cm/s                        LV E/e' lateral: 6.3  RIGHT VENTRICLE RV Basal diam:  3.10 cm LEFT ATRIUM             Index        RIGHT ATRIUM           Index LA diam:        3.90 cm 1.60 cm/m   RA Area:     15.20 cm LA Vol (A2C):   45.9 ml 18.86 ml/m  RA Volume:   40.80 ml  16.77 ml/m LA Vol (A4C):   51.2 ml 21.04 ml/m LA Biplane Vol: 49.1 ml 20.18 ml/m  AORTIC VALVE                    PULMONIC VALVE AV Vmax:           116.00 cm/s PV Vmax:       0.75 m/s AV Vmean:          85.333 cm/s PV Peak grad:  2.3 mmHg AV VTI:            0.217 m AV Peak Grad:      5.4 mmHg AV Mean Grad:      3.3 mmHg LVOT Vmax:         86.80 cm/s LVOT Vmean:        60.200 cm/s LVOT VTI:          0.147 m LVOT/AV VTI ratio: 0.68 MITRAL VALVE MV Area (PHT): 6.37 cm    SHUNTS MV Peak grad:  3.1 mmHg    Systemic VTI: 0.15 m MV Mean grad:  1.0 mmHg MV Vmax:       0.88 m/s MV Vmean:      40.8 cm/s MV Decel Time: 119 msec MV E velocity: 93.80 cm/s Rozann Lesches MD Electronically signed by Rozann Lesches MD Signature Date/Time: 11/06/2020/12:13:20 PM    Final    ECHO TEE  Result Date: 11/07/2020    TRANSESOPHOGEAL ECHO  REPORT   Patient Name:   Mitchell EWELL Date of Exam: 11/07/2020 Medical Rec #:  [id number]      Height:       71.0 in Accession #:    [id number]     Weight:       280.2 lb Date of Birth:  [birth year]     BSA:          2.433 m Patient Age:    69 years       BP:           131/100 mmHg Patient Gender: M              HR:           128 bpm. Exam Location:  [facility] Procedure: Transesophageal Echo, Color Doppler and Cardiac Doppler Indications:    Atrial flutter I48.92  History:        Patient has prior history of Echocardiogram examinations, most                 recent 11/06/2020. Arrythmias:Atrial Flutter; Risk                 Factors:Hypertension. Morid Obesity.  Sonographer:    Alvino Chapel RCS Referring Phys: [id number] [place]: TEE procedure time was 4 minutes. The transesophogeal probe was passed without difficulty through the esophogus of the patient. Imaged were obtained with the patient in a left lateral decubitus position. Local oropharyngeal anesthetic was provided with viscous lidocaine. Sedation performed by different physician. Image quality was adequate. The patient developed Respiratory depression during the procedure. IMPRESSIONS  1. Left ventricular ejection fraction, by  estimation, is 50 to 55%. The left ventricle has low normal function.  2. Right ventricular systolic function is normal. The right ventricular size is normal.  3. No left atrial/left atrial appendage thrombus was detected. The LAA emptying velocity was 90 cm/s.  4. The mitral valve is grossly normal. Mild mitral valve regurgitation.  5. The aortic valve is tricuspid. Aortic valve regurgitation is not visualized.  6. Transgastric views not obtained, scope withdrawn due to need for respiratory support by anesthesia team. FINDINGS  Left Ventricle: Left ventricular ejection fraction, by estimation, is 50 to 55%. The left ventricle has low normal function. The left ventricular internal cavity size was normal in size. Right Ventricle: The right ventricular size is normal. Right vetricular wall thickness was not assessed. Right ventricular systolic function is normal. Left Atrium: Left atrial size was normal in size. No left atrial/left atrial appendage thrombus was detected. The LAA emptying velocity was 90 cm/s. Right Atrium: Right atrial size was normal in size. Pericardium: There is no evidence of pericardial effusion. Mitral Valve: The mitral valve is grossly normal. Mild mitral valve regurgitation. Tricuspid Valve: The tricuspid valve is grossly normal. Tricuspid valve regurgitation is trivial. Aortic Valve: The aortic valve is tricuspid. Aortic valve regurgitation is not visualized. Pulmonic Valve: The pulmonic valve was grossly normal. Pulmonic valve regurgitation is trivial. Aorta: Transgastric views not obtained, scope withdrawn due to need for respiratory support by anesthesia team. The aortic root is normal in size and structure. IAS/Shunts: No atrial level shunt detected by color flow Doppler. Rozann Lesches MD Electronically signed by Rozann Lesches MD Signature Date/Time: 11/07/2020/12:01:44 PM    Final     Microbiology: Recent Results (from the past 240 hour(s))  Resp Panel by RT-PCR (Flu A&B, Covid)  Nasopharyngeal Swab     Status: None   Collection Time: 11/05/20  8:21 PM   Specimen: Nasopharyngeal Swab; Nasopharyngeal(NP) swabs in vial transport medium  Result Value Ref Range Status   SARS Coronavirus 2 by RT PCR NEGATIVE NEGATIVE Final    Comment: (NOTE) SARS-CoV-2 target nucleic acids are NOT DETECTED.  The SARS-CoV-2 RNA is generally detectable in upper respiratory specimens during the acute phase of infection. The lowest concentration of SARS-CoV-2 viral copies this assay can detect is 138 copies/mL. A negative result does not preclude SARS-Cov-2 infection and should not be used as the sole basis for treatment or other patient management decisions. A negative result may occur with  improper specimen collection/handling, submission of specimen other than nasopharyngeal swab, presence of viral mutation(s) within the areas targeted by this assay, and inadequate number of viral copies(<138 copies/mL). A negative result must be combined with clinical observations, patient history, and epidemiological information. The expected result is Negative.  Fact Sheet for Patients:  BloggerCourse.com  Fact Sheet for Healthcare Providers:  SeriousBroker.it  This test is no t yet approved or cleared by the [place] FDA and  has been authorized for detection and/or diagnosis of SARS-CoV-2 by FDA under an Emergency Use Authorization (EUA). This EUA will remain  in effect (meaning this test can be used) for the duration of the COVID-19 declaration under Section 564(b)(1) of the Act, 21 U.S.C.section 360bbb-3(b)(1), unless the authorization is terminated  or revoked sooner.       Influenza A by PCR NEGATIVE NEGATIVE Final   Influenza B by PCR NEGATIVE NEGATIVE Final    Comment: (NOTE) The Xpert Xpress SARS-CoV-2/FLU/RSV plus assay is intended as an aid in the diagnosis of influenza from Nasopharyngeal swab specimens and should not be  used as a sole basis for treatment. Nasal washings and aspirates are unacceptable for Xpert Xpress SARS-CoV-2/FLU/RSV testing.  Fact Sheet for Patients: BloggerCourse.com  Fact Sheet for Healthcare Providers: SeriousBroker.it  This test is not yet approved or cleared by the [place] FDA and has been authorized for detection and/or diagnosis of SARS-CoV-2 by FDA under an Emergency Use Authorization (EUA). This EUA will remain in effect (meaning this test can be used) for the duration of the COVID-19 declaration under Section 564(b)(1) of the Act, 21 U.S.C. section 360bbb-3(b)(1), unless the authorization is terminated or revoked.  Performed at [facility], [address] [place], [place] [address]   MRSA Next Gen by PCR, Nasal     Status: None   Collection Time: 11/05/20 10:32 PM   Specimen: Nasal Mucosa; Nasal Swab  Result Value Ref Range Status   MRSA by PCR Next Gen NOT DETECTED NOT DETECTED Final    Comment: (NOTE) The GeneXpert MRSA Assay (FDA approved for NASAL specimens only), is one component of a comprehensive MRSA colonization surveillance program. It is not intended to diagnose MRSA infection nor to guide or monitor treatment for MRSA infections. Test performance is not FDA approved in patients less than 68 years old. Performed at [facility], [address], [place], [place] [address]      Labs: Basic Metabolic Panel: Recent Labs  Lab 11/05/20 1809 11/05/20 1824 11/06/20 0429 11/07/20 0419  NA 137  --  137 137  K 3.6  --  3.7 3.9  CL 99  --  102 100  CO2 29  --  28 27  GLUCOSE 111*  --  107* 118*  BUN 25*  --  23* 25*  CREATININE 1.51*  --  1.41* 1.40*  CALCIUM 9.1  --  8.8* 8.9  MG  --  1.9 1.9 2.0   Liver Function Tests: Recent Labs  Lab 11/06/20 0429  AST 31  ALT 39  ALKPHOS 62  BILITOT 1.2  PROT 7.0  ALBUMIN 3.6   No results for input(s): LIPASE, AMYLASE in the last 168 hours. No  results for input(s): AMMONIA in the last 168 hours. CBC: Recent Labs  Lab 11/05/20 1809 11/06/20 0429  WBC 9.5 9.4  NEUTROABS  --  5.7  HGB 15.9 15.3  HCT 49.2 46.1  MCV 101.0* 98.9  PLT 157 150   Cardiac Enzymes: No results for input(s): CKTOTAL, CKMB, CKMBINDEX, TROPONINI in the last 168 hours. BNP: Invalid input(s): POCBNP CBG: No results for input(s): GLUCAP in the last 168 hours.  Time coordinating discharge:  36 minutes  Signed:  Orson Eva, DO [facility] Pager: [phone] 11/07/2020, 4:05 PM

## 2020-11-07 NOTE — Progress Notes (Addendum)
Progress Note  Patient Name: Mitchell Nichols Date of Encounter: 11/07/2020  [facility] Cardiologist: [name], MD   Subjective   No complaints this AM. Breathing has been at baseline but he has mostly been in bed overnight and this morning. No chest pain or palpitations. He has been NPO since midnight for planned TEE/DCCV today.   Inpatient Medications    Scheduled Meds:  apixaban  5 mg Oral BID   Chlorhexidine Gluconate Cloth  6 each Topical Daily   mouth rinse  15 mL Mouth Rinse BID   Continuous Infusions:  diltiazem (CARDIZEM) infusion 10 mg/hr (11/07/20 0600)   PRN Meds: acetaminophen **OR** acetaminophen, morphine injection, ondansetron **OR** ondansetron (ZOFRAN) IV, oxyCODONE   Vital Signs    Vitals:   11/07/20 0500 11/07/20 0530 11/07/20 0600 11/07/20 0700  BP: (!) 88/56 126/87 117/62   Pulse: (!) 43 (!) 132 (!) 51   Resp: (!) 22 (!) 22 18   Temp:    97.7 F (36.5 C)  TempSrc:    Oral  SpO2: 96% 96% 99%   Weight:      Height:        Intake/Output Summary (Last 24 hours) at 11/07/2020 0811 Last data filed at 11/07/2020 0600 Gross per 24 hour  Intake 327.65 ml  Output 950 ml  Net -622.35 ml   Last 3 Weights 11/05/2020 11/05/2020 03/27/2019  Weight (lbs) 280 lb 3.3 oz 281 lb 15.5 oz 250 lb  Weight (kg) 127.1 kg 127.9 kg 113.399 kg      Telemetry    Atrial flutter with variable block, HR in 80's to 120's.  - Personally Reviewed  ECG    No new tracings.   Physical Exam   GEN: Pleasant male appearing in no acute distress.   Neck: No JVD Cardiac: Irregularly irregular, tachycardiac. No murmurs, rubs, or gallops.  Respiratory: Clear to auscultation bilaterally. GI: Soft, nontender, non-distended  MS: Trace ankle edema bilaterally; No deformity. Neuro:  Nonfocal  Psych: Normal affect   Labs    High Sensitivity Troponin:   Recent Labs  Lab 11/05/20 1809 11/05/20 2008  TROPONINIHS 6 5     Chemistry Recent Labs  Lab 11/05/20 1809  11/05/20 1824 11/06/20 0429 11/07/20 0419  NA 137  --  137 137  K 3.6  --  3.7 3.9  CL 99  --  102 100  CO2 29  --  28 27  GLUCOSE 111*  --  107* 118*  BUN 25*  --  23* 25*  CREATININE 1.51*  --  1.41* 1.40*  CALCIUM 9.1  --  8.8* 8.9  MG  --  1.9 1.9 2.0  PROT  --   --  7.0  --   ALBUMIN  --   --  3.6  --   AST  --   --  31  --   ALT  --   --  39  --   ALKPHOS  --   --  62  --   BILITOT  --   --  1.2  --   GFRNONAA 54*  --  58* 59*  ANIONGAP 9  --  7 10     Hematology Recent Labs  Lab 11/05/20 1809 11/06/20 0429  WBC 9.5 9.4  RBC 4.87 4.66  HGB 15.9 15.3  HCT 49.2 46.1  MCV 101.0* 98.9  MCH 32.6 32.8  MCHC 32.3 33.2  RDW 12.9 12.9  PLT 157 150   Thyroid  Recent Labs  Lab 11/06/20 0429  TSH 3.518     Radiology    DG Chest Port 1 View  Result Date: 11/05/2020  CLINICAL DATA:  Abnormal EKG. Rapid heart rate chest pain and shortness of breath. EXAM: PORTABLE CHEST 1 VIEW COMPARISON:  Chest radiograph 06/03/2016 FINDINGS: Mildly enlarged cardiopericardial silhouette, may be accentuated by portable AP technique. Stable mediastinal contours. No pulmonary edema, focal airspace disease, pleural effusion or pneumothorax. IMPRESSION: Mildly enlarged cardiopericardial silhouette, may be accentuated by portable AP technique. Electronically Signed   By: Keith Rake M.D.   On: 11/05/2020 18:20   Cardiac Studies   Echocardiogram: 11/06/2020 IMPRESSIONS    1. Left ventricular ejection fraction, by estimation, is 50 to 55%. The  left ventricle has low normal function. The left ventricle has no regional  wall motion abnormalities. There is moderate left ventricular hypertrophy.  Left ventricular diastolic  parameters are indeterminate in the setting of atrial flutter.   2. Right ventricular systolic function is low normal. The right  ventricular size is normal. Tricuspid regurgitation signal is inadequate  for assessing PA pressure.   3. The mitral valve is grossly  normal. Trivial mitral valve  regurgitation.   4. The aortic valve is tricuspid. Aortic valve regurgitation is not  visualized. No aortic stenosis is present. Aortic valve mean gradient  measures 3.3 mmHg.   5. The inferior vena cava is normal in size with greater than 50%  respiratory variability, suggesting right atrial pressure of 3 mmHg.   Comparison(s): No prior Echocardiogram.   Patient Profile     55 y.o. male with a past medical history of HTN and GERD who is currently admitted for newly diagnosed atrial fibrillation/flutter.   Assessment & Plan    1. Atrial Flutter with RVR - Suspect he has been in the arrhythmia for over a month given reports of fatigue, dyspnea on exertion and presyncope during that time-frame. Electrolytes and TSH within normal limits. Echocardiogram shows his EF is mildly reduced at 50-55% but no wall motion abnormalities. He did have trivial MR but no significant valve abnormalities.  - He remains on IV Cardizem for rate-control but rates still peaking into the 130's with minimal activity. Will proceed with planned TEE/DCCV today. Risks and benefits have been reviewed and he is in agreement to proceed. He has been NPO since midnight.  - This patients CHA2DS2-VASc Score and unadjusted Ischemic Stroke Rate (% per year) is equal to 0.6 % stroke rate/year from a score of 1. He has been started on Eliquis 5mg  BID for anticoagulation given the need for TEE/DCCV.   2. HTN - BP has been variable from 88/56 - 142/119 within the past 24 hours, at 117/62 on most recent check. He remains on IV Cardizem and PTA Lisinopril-HCTZ has been held given variable BP. Would plan to stop IV Cardizem and switch to a PO AV nodal blocking agent later today if NSR is restored with planned DCCV.   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   4. Screening for OSA - Given his atrial fibrillation/flutter and body habitus, would recommend  arranging for a sleep study as an outpatient.    For questions or updates, please contact [place] Please consult [email or web address] for contact info under      Signed, Erma Heritage, PA-C  11/07/2020, 8:11 AM     Attending note:  Patient seen and examined.  He remains in typical atrial flutter with variable  conduction this morning on diltiazem infusion 15 mg/h.  No complaint of chest pain or palpitations.  He is afebrile, systolic blood pressure 99991111.  Lungs clear, cardiac exam with irregularly irregular rhythm.  Pertinent lab review potassium 3.1.5, creatinine 1.4, hemoglobin 15.3, platelets 150, TSH 3.51.  Patient is scheduled for TEE cardioversion today.  Would discontinue diltiazem infusion prior to procedure.  Continue Eliquis.  Satira Sark, M.D., F.A.C.C.

## 2020-11-07 NOTE — Anesthesia Postprocedure Evaluation (Signed)
Anesthesia Post Note  Patient: Deniro Laymon  Procedure(s) Performed: TRANSESOPHAGEAL ECHOCARDIOGRAM (TEE) CARDIOVERSION  Patient location during evaluation: PACU Anesthesia Type: General Level of consciousness: awake and alert Pain management: pain level controlled Vital Signs Assessment: post-procedure vital signs reviewed and stable Respiratory status: spontaneous breathing Cardiovascular status: blood pressure returned to baseline and stable Postop Assessment: no apparent nausea or vomiting Anesthetic complications: no   No notable events documented.   Last Vitals:  Vitals:   11/07/20 0943 11/07/20 1140  BP:    Pulse: (!) 52 81  Resp: 16   Temp: 36.7 C 36.7 C  SpO2: 93%     Last Pain:  Vitals:   11/07/20 0943  TempSrc: Oral  PainSc:                  Glynn Octave

## 2020-11-07 NOTE — Progress Notes (Signed)
Pt ambulated 50 ft in hallway without any difficulty with HR WNLs; pt denies any chest pain or SOB.

## 2020-11-07 NOTE — Interval H&P Note (Signed)
History and Physical Interval Note:  11/07/2020 10:02 AM  Mitchell Nichols  has presented today for surgery, with the diagnosis of atrial flutter.  The various methods of treatment have been discussed with the patient and family. After consideration of risks, benefits and other options for treatment, the patient has consented to  Procedure(s): TRANSESOPHAGEAL ECHOCARDIOGRAM (TEE) (N/A) CARDIOVERSION (N/A) as a surgical intervention.  The patient's history has been reviewed, patient examined, no change in status, stable for surgery.  I have reviewed the patient's chart and labs.  Questions were answered to the patient's satisfaction.     [name]

## 2020-11-07 NOTE — Transfer of Care (Signed)
Immediate Anesthesia Transfer of Care Note  Patient: Mitchell Nichols  Procedure(s) Performed: TRANSESOPHAGEAL ECHOCARDIOGRAM (TEE) CARDIOVERSION  Patient Location: PACU  Anesthesia Type:General  Level of Consciousness: awake  Airway & Oxygen Therapy: Patient Spontanous Breathing  Post-op Assessment: Report given to RN  Post vital signs: Reviewed and stable  Last Vitals:  Vitals Value Taken Time  BP    Temp    Pulse    Resp    SpO2      Last Pain:  Vitals:   11/07/20 0943  TempSrc: Oral  PainSc:       Patients Stated Pain Goal: 8 (11/07/20 0943)  Complications: No notable events documented.

## 2020-11-07 NOTE — Progress Notes (Signed)
Electrical Cardioversion Procedure Note Mitchell Nichols [id number] [birth year]  Procedure: Electrical Cardioversion Indications:  Atrial Flutter  Procedure Details Consent: Risks of procedure as well as the alternatives and risks of each were explained to the (patient/caregiver).  Consent for procedure obtained. Time Out: Verified patient identification, verified procedure, site/side was marked, verified correct patient position, special equipment/implants available, medications/allergies/relevent history reviewed, required imaging and test results available.  Performed  Patient placed on cardiac monitor, pulse oximetry, supplemental oxygen as necessary.  Sedation given: Short-acting barbiturates Pacer pads placed anterior and posterior chest.  Cardioverted 1 time(s).  Cardioverted at 120J.  Evaluation Findings: Post procedure EKG shows: NSR Complications: None Patient did tolerate procedure well.   Trenton Gammon S 11/07/2020, 12:45 PM

## 2020-11-07 NOTE — H&P (View-Only) (Signed)
Progress Note  Patient Name: Mitchell Nichols Date of Encounter: 11/07/2020  [facility] Cardiologist: [name], MD   Subjective   No complaints this AM. Breathing has been at baseline but he has mostly been in bed overnight and this morning. No chest pain or palpitations. He has been NPO since midnight for planned TEE/DCCV today.   Inpatient Medications    Scheduled Meds:  apixaban  5 mg Oral BID   Chlorhexidine Gluconate Cloth  6 each Topical Daily   mouth rinse  15 mL Mouth Rinse BID   Continuous Infusions:  diltiazem (CARDIZEM) infusion 10 mg/hr (11/07/20 0600)   PRN Meds: acetaminophen **OR** acetaminophen, morphine injection, ondansetron **OR** ondansetron (ZOFRAN) IV, oxyCODONE   Vital Signs    Vitals:   11/07/20 0500 11/07/20 0530 11/07/20 0600 11/07/20 0700  BP: (!) 88/56 126/87 117/62   Pulse: (!) 43 (!) 132 (!) 51   Resp: (!) 22 (!) 22 18   Temp:    97.7 F (36.5 C)  TempSrc:    Oral  SpO2: 96% 96% 99%   Weight:      Height:        Intake/Output Summary (Last 24 hours) at 11/07/2020 0811 Last data filed at 11/07/2020 0600 Gross per 24 hour  Intake 327.65 ml  Output 950 ml  Net -622.35 ml   Last 3 Weights 11/05/2020 11/05/2020 03/27/2019  Weight (lbs) 280 lb 3.3 oz 281 lb 15.5 oz 250 lb  Weight (kg) 127.1 kg 127.9 kg 113.399 kg      Telemetry    Atrial flutter with variable block, HR in 80's to 120's.  - Personally Reviewed  ECG    No new tracings.   Physical Exam   GEN: Pleasant male appearing in no acute distress.   Neck: No JVD Cardiac: Irregularly irregular, tachycardiac. No murmurs, rubs, or gallops.  Respiratory: Clear to auscultation bilaterally. GI: Soft, nontender, non-distended  MS: Trace ankle edema bilaterally; No deformity. Neuro:  Nonfocal  Psych: Normal affect   Labs    High Sensitivity Troponin:   Recent Labs  Lab 11/05/20 1809 11/05/20 2008  TROPONINIHS 6 5     Chemistry Recent Labs  Lab 11/05/20 1809  11/05/20 1824 11/06/20 0429 11/07/20 0419  NA 137  --  137 137  K 3.6  --  3.7 3.9  CL 99  --  102 100  CO2 29  --  28 27  GLUCOSE 111*  --  107* 118*  BUN 25*  --  23* 25*  CREATININE 1.51*  --  1.41* 1.40*  CALCIUM 9.1  --  8.8* 8.9  MG  --  1.9 1.9 2.0  PROT  --   --  7.0  --   ALBUMIN  --   --  3.6  --   AST  --   --  31  --   ALT  --   --  39  --   ALKPHOS  --   --  62  --   BILITOT  --   --  1.2  --   GFRNONAA 54*  --  58* 59*  ANIONGAP 9  --  7 10     Hematology Recent Labs  Lab 11/05/20 1809 11/06/20 0429  WBC 9.5 9.4  RBC 4.87 4.66  HGB 15.9 15.3  HCT 49.2 46.1  MCV 101.0* 98.9  MCH 32.6 32.8  MCHC 32.3 33.2  RDW 12.9 12.9  PLT 157 150   Thyroid  Recent Labs  Lab 11/06/20 0429  TSH 3.518     Radiology    DG Chest Port 1 View  Result Date: 11/05/2020  CLINICAL DATA:  Abnormal EKG. Rapid heart rate chest pain and shortness of breath. EXAM: PORTABLE CHEST 1 VIEW COMPARISON:  Chest radiograph 06/03/2016 FINDINGS: Mildly enlarged cardiopericardial silhouette, may be accentuated by portable AP technique. Stable mediastinal contours. No pulmonary edema, focal airspace disease, pleural effusion or pneumothorax. IMPRESSION: Mildly enlarged cardiopericardial silhouette, may be accentuated by portable AP technique. Electronically Signed   By: Keith Rake M.D.   On: 11/05/2020 18:20   Cardiac Studies   Echocardiogram: 11/06/2020 IMPRESSIONS    1. Left ventricular ejection fraction, by estimation, is 50 to 55%. The  left ventricle has low normal function. The left ventricle has no regional  wall motion abnormalities. There is moderate left ventricular hypertrophy.  Left ventricular diastolic  parameters are indeterminate in the setting of atrial flutter.   2. Right ventricular systolic function is low normal. The right  ventricular size is normal. Tricuspid regurgitation signal is inadequate  for assessing PA pressure.   3. The mitral valve is grossly  normal. Trivial mitral valve  regurgitation.   4. The aortic valve is tricuspid. Aortic valve regurgitation is not  visualized. No aortic stenosis is present. Aortic valve mean gradient  measures 3.3 mmHg.   5. The inferior vena cava is normal in size with greater than 50%  respiratory variability, suggesting right atrial pressure of 3 mmHg.   Comparison(s): No prior Echocardiogram.   Patient Profile     55 y.o. male with a past medical history of HTN and GERD who is currently admitted for newly diagnosed atrial fibrillation/flutter.   Assessment & Plan    1. Atrial Flutter with RVR - Suspect he has been in the arrhythmia for over a month given reports of fatigue, dyspnea on exertion and presyncope during that time-frame. Electrolytes and TSH within normal limits. Echocardiogram shows his EF is mildly reduced at 50-55% but no wall motion abnormalities. He did have trivial MR but no significant valve abnormalities.  - He remains on IV Cardizem for rate-control but rates still peaking into the 130's with minimal activity. Will proceed with planned TEE/DCCV today. Risks and benefits have been reviewed and he is in agreement to proceed. He has been NPO since midnight.  - This patients CHA2DS2-VASc Score and unadjusted Ischemic Stroke Rate (% per year) is equal to 0.6 % stroke rate/year from a score of 1. He has been started on Eliquis 5mg  BID for anticoagulation given the need for TEE/DCCV.   2. HTN - BP has been variable from 88/56 - 142/119 within the past 24 hours, at 117/62 on most recent check. He remains on IV Cardizem and PTA Lisinopril-HCTZ has been held given variable BP. Would plan to stop IV Cardizem and switch to a PO AV nodal blocking agent later today if NSR is restored with planned DCCV.   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   4. Screening for OSA - Given his atrial fibrillation/flutter and body habitus, would recommend  arranging for a sleep study as an outpatient.    For questions or updates, please contact [place] Please consult [email or web address] for contact info under      Signed, Erma Heritage, PA-C  11/07/2020, 8:11 AM     Attending note:  Patient seen and examined.  He remains in typical atrial flutter with variable  conduction this morning on diltiazem infusion 15 mg/h.  No complaint of chest pain or palpitations.  He is afebrile, systolic blood pressure 99991111.  Lungs clear, cardiac exam with irregularly irregular rhythm.  Pertinent lab review potassium 3.1.5, creatinine 1.4, hemoglobin 15.3, platelets 150, TSH 3.51.  Patient is scheduled for TEE cardioversion today.  Would discontinue diltiazem infusion prior to procedure.  Continue Eliquis.  Satira Sark, M.D., F.A.C.C.

## 2020-11-07 NOTE — Anesthesia Preprocedure Evaluation (Signed)
Anesthesia Evaluation  Patient identified by MRN, date of birth, ID band Patient awake    Reviewed: Allergy & Precautions, H&P , NPO status , Patient's Chart, lab work & pertinent test results, reviewed documented beta blocker date and time   Airway Mallampati: II  TM Distance: >3 FB Neck ROM: full    Dental no notable dental hx.    Pulmonary neg pulmonary ROS,    Pulmonary exam normal breath sounds clear to auscultation       Cardiovascular Exercise Tolerance: Good hypertension, negative cardio ROS  Atrial Fibrillation  Rhythm:irregular Rate:Normal     Neuro/Psych negative neurological ROS  negative psych ROS   GI/Hepatic negative GI ROS, Neg liver ROS,   Endo/Other  negative endocrine ROS  Renal/GU negative Renal ROS  negative genitourinary   Musculoskeletal   Abdominal   Peds  Hematology negative hematology ROS (+)   Anesthesia Other Findings 1. Left ventricular ejection fraction, by estimation, is 50 to 55%. The  left ventricle has low normal function. The left ventricle has no regional  wall motion abnormalities. There is moderate left ventricular hypertrophy.  Left ventricular diastolic  parameters are indeterminate in the setting of atrial flutter.  2. Right ventricular systolic function is low normal. The right  ventricular size is normal. Tricuspid regurgitation signal is inadequate  for assessing PA pressure.  3. The mitral valve is grossly normal. Trivial mitral valve  regurgitation.  4. The aortic valve is tricuspid. Aortic valve regurgitation is not  visualized. No aortic stenosis is present. Aortic valve mean gradient  measures 3.3 mmHg.  5. The inferior vena cava is normal in size with greater than 50%  respiratory variability, suggesting right atrial pressure of 3 mmHg.   Reproductive/Obstetrics negative OB ROS                             Anesthesia  Physical Anesthesia Plan  ASA: 3  Anesthesia Plan: General   Post-op Pain Management:    Induction:   PONV Risk Score and Plan: Propofol infusion  Airway Management Planned:   Additional Equipment:   Intra-op Plan:   Post-operative Plan:   Informed Consent: I have reviewed the patients History and Physical, chart, labs and discussed the procedure including the risks, benefits and alternatives for the proposed anesthesia with the patient or authorized representative who has indicated his/her understanding and acceptance.     Dental Advisory Given  Plan Discussed with: CRNA  Anesthesia Plan Comments:         Anesthesia Quick Evaluation

## 2020-11-07 NOTE — Progress Notes (Signed)
*  PRELIMINARY RESULTS* Echocardiogram Echocardiogram Transesophageal has been performed.  Stacey Drain 11/07/2020, 11:21 AM

## 2020-11-07 NOTE — CV Procedure (Signed)
Transesophageal echocardiogram guided cardioversion  Indication: Persistent atrial flutter  Description of procedure: After informed consent was obtained the patient was taken to the PACU where a timeout was performed.  He was placed in left lateral decubitus position with anterior and posterior pads placed, connected to a biphasic defibrillator in anticipation of cardioversion.  Oropharynx was anesthetized with viscous lidocaine.  Bite-block placed.  Suction equipment and supplemental oxygen provided.  Deep sedation was achieved via use of propofol per the anesthesia service.  A multiplane, transesophageal echocardiographic probe was placed in the esophagus and multiple images obtained.  Procedure did need to be interrupted early with removal of probe prior to obtaining transgastric views due to respiratory depression and hypoxia requiring stabilization and additional oxygen supplementation by the anesthesia team.  In summary, LVEF 50 to 55% in the setting of atrial flutter, RV contraction grossly normal.  Mitral valve normal with mild mitral regurgitation.  No left atrial appendage thrombus noted with emptying velocity 90 cm/s.  No right atrial appendage thrombus noted.  Tricuspid valve grossly normal with trace tricuspid regurgitation.  Pulmonic valve grossly normal with trace pulmonic regurgitation.  Aortic valve trileaflet with no aortic regurgitation.  Once respiratory status stabilized through use of supplemental oxygen and suctioning with modification of sedation, attention was then turned to cardioversion.  With sandbag placed on anterior chest pad, a single synchronized 120 J shock was delivered with successful restoration of sinus rhythm.  Patient remained hemodynamically stable and rhythm was confirmed by postprocedure ECG.  Jonelle Sidle, M.D., F.A.C.C.

## 2020-11-08 ENCOUNTER — Encounter (HOSPITAL_COMMUNITY): Payer: Self-pay | Admitting: Cardiology

## 2020-11-25 ENCOUNTER — Encounter: Payer: Self-pay | Admitting: Nurse Practitioner

## 2020-11-25 ENCOUNTER — Other Ambulatory Visit: Payer: Self-pay

## 2020-11-25 ENCOUNTER — Ambulatory Visit (INDEPENDENT_AMBULATORY_CARE_PROVIDER_SITE_OTHER): Payer: 59 | Admitting: Nurse Practitioner

## 2020-11-25 VITALS — BP 170/100 | HR 64 | Ht 71.5 in | Wt 284.0 lb

## 2020-11-25 DIAGNOSIS — I4892 Unspecified atrial flutter: Secondary | ICD-10-CM | POA: Diagnosis not present

## 2020-11-25 DIAGNOSIS — I10 Essential (primary) hypertension: Secondary | ICD-10-CM

## 2020-11-25 DIAGNOSIS — N182 Chronic kidney disease, stage 2 (mild): Secondary | ICD-10-CM

## 2020-11-25 DIAGNOSIS — Z79899 Other long term (current) drug therapy: Secondary | ICD-10-CM | POA: Diagnosis not present

## 2020-11-25 DIAGNOSIS — G4730 Sleep apnea, unspecified: Secondary | ICD-10-CM

## 2020-11-25 MED ORDER — LISINOPRIL 20 MG PO TABS: 20.0000 mg | ORAL_TABLET | Freq: Every day | ORAL | 3 refills | Status: DC

## 2020-11-25 NOTE — Progress Notes (Signed)
Office Visit    Patient Name: Mitchell Nichols Date of Encounter: 11/25/2020  Primary Care Provider:  Rebekah Chesterfield, NP Primary Cardiologist:  [name], MD  Chief Complaint    55 year old male with a history of hypertension, GERD, CKD II-III, nephrolithiasis, and hernia, presents for follow-up after recent hospitalization for rapid atrial flutter requiring cardioversion.  Past Medical History    Past Medical History:  Diagnosis Date   Atrial flutter (HCC)    a. 11/2020 s/p TEE/DCCV (120J x 1); b. CHA2DS2VASc = 1-->Eliquis.   CKD (chronic kidney disease), stage II- III (HCC)    Hernia    Hypertension    Kidney stones    LVH (left ventricular hypertrophy)    a. 11/2020 Echo: EF 50-55%, no rwma, mod LVH. Nl RV size/fxn. Triv MR.   Morbid obesity (HCC)    Snores    Past Surgical History:  Procedure Laterality Date   CARDIOVERSION N/A 11/07/2020   Procedure: CARDIOVERSION;  Surgeon: Jonelle Sidle, MD;  Location: [facility];  Service: Cardiovascular;  Laterality: N/A;   HAND SURGERY Left    HERNIA REPAIR     KNEE SURGERY     TEE WITHOUT CARDIOVERSION N/A 11/07/2020   Procedure: TRANSESOPHAGEAL ECHOCARDIOGRAM (TEE);  Surgeon: Jonelle Sidle, MD;  Location: [facility];  Service: Cardiovascular;  Laterality: N/A;    Allergies  No Known Allergies  History of Present Illness    55 year old male with the above past medical history including hypertension, CKD II-III, GERD, nephrolithiasis, and hernia.  In November 1, he was seen by his primary care provider with complaints of dyspnea on exertion and fatigue x1 month.  He was noted to have an elevated heart rate and was referred to the emergency department where he was found to be in rapid atrial flutter with 2-1 conduction and a heart rate of 138.  Lab work was unremarkable.  Troponins were normal.  He was placed on IV diltiazem along with Eliquis with some improvement in rates to the low 100s to 110s.  Echocardiogram  showed low-normal LV function @ 50-55%, with moderate LVH and w/o significant valvular disease.  He subsequently underwent TEE and cardioversion on November 3  1, 120 J shock, which was successful.  He was then discharged home on Toprol-XL 50 mg daily and Eliquis 5 mg twice daily.  Since discharge, he has done well.  He notes significant improvement in energy and denies chest pain, palpitations, dyspnea, PND, orthopnea, dizziness, syncope, edema, or early satiety.  We discussed catheter ablation as a potential future option for management of atrial flutter at this time, he is not interested in meeting with electrophysiology.  We also discussed his sleeping habits and he does note a history of snoring.  He is interested in a sleep medicine referral.  Home Medications    Current Outpatient Medications  Medication Sig Dispense Refill   apixaban (ELIQUIS) 5 MG TABS tablet Take 1 tablet (5 mg total) by mouth 2 (two) times daily. 60 tablet 1   lisinopril (ZESTRIL) 20 MG tablet Take 1 tablet (20 mg total) by mouth daily. 90 tablet 3   metoprolol succinate (TOPROL-XL) 50 MG 24 hr tablet Take 1 tablet (50 mg total) by mouth daily. Take with or immediately following a meal. 30 tablet 1   No current facility-administered medications for this visit.     Review of Systems    Feels much better since cardioversion.  He denies chest pain, palpitations, dyspnea, pnd, orthopnea, n,  v, dizziness, syncope, edema, weight gain, or early satiety.  All other systems reviewed and are otherwise negative except as noted above.   Physical Exam    VS:  BP (!) 170/100   Pulse 64   Ht 5' 11.5" (1.816 m)   Wt 284 lb (128.8 kg)   SpO2 94%   BMI 39.06 kg/m  , BMI Body mass index is 39.06 kg/m. Vitals:   11/25/20 1534 11/25/20 1622  BP: (!) 158/98 (!) 170/100  Pulse: 64   SpO2: 94%   Weight: 284 lb (128.8 kg)   Height: 5' 11.5" (1.816 m)     STOP-Bang Score:  8      GEN: Obese, in no acute distress. HEENT:  normal. Neck: Supple, obese, difficult to gauge JVP.  Carotid bruits, or masses. Cardiac: RRR, no murmurs, rubs, or gallops. No clubbing, cyanosis, trace bilateral ankle edema.  Radials/DP/PT 2+ and equal bilaterally.  Respiratory:  Respirations regular and unlabored, clear to auscultation bilaterally. GI: Soft, nontender, nondistended, BS + x 4. MS: no deformity or atrophy. Skin: warm and dry, no rash. Neuro:  Strength and sensation are intact. Psych: Normal affect.  Accessory Clinical Findings    ECG personally reviewed by me today -regular sinus rhythm, 64, rightward axis- no acute changes.  Lab Results  Component Value Date   WBC 9.4 11/06/2020   HGB 15.3 11/06/2020   HCT 46.1 11/06/2020   MCV 98.9 11/06/2020   PLT 150 11/06/2020   Lab Results  Component Value Date   CREATININE 1.40 (H) 11/07/2020   BUN 25 (H) 11/07/2020   NA 137 11/07/2020   K 3.9 11/07/2020   CL 100 11/07/2020   CO2 27 11/07/2020   Lab Results  Component Value Date   ALT 39 11/06/2020   AST 31 11/06/2020   ALKPHOS 62 11/06/2020   BILITOT 1.2 11/06/2020    Assessment & Plan    1.  Atrial flutter: Patient was recently admitted with a 1 month history of fatigue, dyspnea, and lightheadedness, was found to be in atrial flutter.  He underwent successful TEE and cardioversion and has since been maintaining sinus rhythm and feeling better.  Heart rate is 64 today.  He has been tolerating metoprolol and Eliquis and notes significant improvement in energy.  We discussed ongoing management of atrial flutter to potentially include EP evaluation for consideration of catheter ablation.  Patient would like to think on this right now.  He does report a snoring history and with his history of obesity and hypertension, he is at high risk for sleep apnea.  He was agreeable to a referral to pulmonology for sleep study.  We also discussed his obesity and he plans to join a gym to begin exercising and says he is committed to  watching his caloric intake.  2.  Essential hypertension: Blood pressure initially elevated 158/98.  I did repeat this and got 170/100.  Patient was previously on lisinopril-HCTZ prior to atrial flutter diagnosis.  He is currently on Toprol-XL 50 mg daily with a heart rate of 64.  I am going to add back lisinopril 20 mg daily and we will arrange for follow-up basic metabolic panel next week.  3.  Stage II-III chronic kidney disease: Creatinine was 1.40 on November 3.  Adding back lisinopril therapy for renal protection.  Follow-up basic metabolic panel in 1 week.  4.  Morbid obesity: BMI of 39.06.  We discussed the importance of weight management and limiting future exposure to atrial arrhythmias,  as well as management of potential sleep apnea.  Patient plans to join a gym with his sister.  We discussed the importance of caloric restriction.  Would like him to lose at least 50 pounds over the next year.  Consider Ozempic.  5.  Sleep disordered breathing: History of snoring and daytime fatigue.  STOP-BANG equals 8.  Refer to pulmonology for sleep study.  6.  Disposition: Follow-up basic metabolic panel next week given resumption of lisinopril therapy.  Follow-up in clinic in 4 to 6 weeks.  Plan to follow-up CBC at office follow-up given recent Eliquis start.   Murray Hodgkins, NP 11/25/2020, 4:22 PM

## 2020-11-25 NOTE — Patient Instructions (Signed)
Medication Instructions:  START Lisinopril 20 mg daily    Labwork:  BMET in 1 week   Testing/Procedures: None today   Follow-Up: 4-6 weeks with Dr.McDowell or APP   Any Other Special Instructions Will Be Listed Below (If Applicable).    You have been referred to Pulmonology for evaluation for sleep study. They will call you to schedule an appointment   If you need a refill on your cardiac medications before your next appointment, please call your pharmacy.

## 2020-12-24 ENCOUNTER — Ambulatory Visit: Payer: 59 | Admitting: Cardiology

## 2020-12-24 NOTE — Progress Notes (Deleted)
Cardiology Office Note  Date: 12/24/2020   ID: Devansh Nichols, DOB May 24, 1965, MRN [id number]  PCP:  Adaline Sill, Nichols  Cardiologist:  Rozann Lesches, Nichols Electrophysiologist:  None   No chief complaint on file.   History of Present Illness: Mitchell Nichols is a 55 y.o. male last seen in November by Mr. Mitchell Nichols.  He is status post successful TEE guided cardioversion in November, CHA2DS2-VASc score of at least 1.  Was started back on lisinopril at last visit due to elevated blood pressure.  He was also referred to Pulmonary for OSA evaluation.  Past Medical History:  Diagnosis Date   Atrial flutter ([place])    a. 11/2020 s/p TEE/DCCV (120J x 1); b. CHA2DS2VASc = 1-->Eliquis.   CKD (chronic kidney disease), stage II- III (HCC)    Hernia    Hypertension    Kidney stones    LVH (left ventricular hypertrophy)    a. 11/2020 Echo: EF 50-55%, no rwma, mod LVH. Nl RV size/fxn. Triv MR.   Morbid obesity ([place])    Snores     Past Surgical History:  Procedure Laterality Date   CARDIOVERSION N/A 11/07/2020   Procedure: CARDIOVERSION;  Surgeon: Mitchell Nichols;  Location: [facility];  Service: Cardiovascular;  Laterality: N/A;   HAND SURGERY Left    HERNIA REPAIR     KNEE SURGERY     TEE WITHOUT CARDIOVERSION N/A 11/07/2020   Procedure: TRANSESOPHAGEAL ECHOCARDIOGRAM (TEE);  Surgeon: Mitchell Nichols;  Location: [facility];  Service: Cardiovascular;  Laterality: N/A;    Current Outpatient Medications  Medication Sig Dispense Refill   apixaban (ELIQUIS) 5 MG TABS tablet Take 1 tablet (5 mg total) by mouth 2 (two) times daily. 60 tablet 1   lisinopril (ZESTRIL) 20 MG tablet Take 1 tablet (20 mg total) by mouth daily. 90 tablet 3   metoprolol succinate (TOPROL-XL) 50 MG 24 hr tablet Take 1 tablet (50 mg total) by mouth daily. Take with or immediately following a meal. 30 tablet 1   No current facility-administered medications for this visit.   Allergies:  Patient has no  known allergies.   Social History: The patient  reports that he has never smoked. He has never used smokeless tobacco. He reports that he does not drink alcohol and does not use drugs.   Family History: The patient's family history includes Cancer in his mother; Coronary artery disease in his father.   ROS:  Please see the history of present illness. Otherwise, complete review of systems is positive for {NONE DEFAULTED:18576}.  All other systems are reviewed and negative.   Physical Exam: VS:  There were no vitals taken for this visit., BMI There is no height or weight on file to calculate BMI.  Wt Readings from Last 3 Encounters:  11/25/20 284 lb (128.8 kg)  11/05/20 280 lb 3.3 oz (127.1 kg)  03/27/19 250 lb (113.4 kg)    General: Patient appears comfortable at rest. HEENT: Conjunctiva and lids normal, oropharynx clear with moist mucosa. Neck: Supple, no elevated JVP or carotid bruits, no thyromegaly. Lungs: Clear to auscultation, nonlabored breathing at rest. Cardiac: Regular rate and rhythm, no S3 or significant systolic murmur, no pericardial rub. Abdomen: Soft, nontender, no hepatomegaly, bowel sounds present, no guarding or rebound. Extremities: No pitting edema, distal pulses 2+. Skin: Warm and dry. Musculoskeletal: No kyphosis. Neuropsychiatric: Alert and oriented x3, affect grossly appropriate.  ECG:  An ECG dated 11/25/2020 was personally reviewed today and demonstrated:  Sinus rhythm with rightward axis.  Recent Labwork: 11/06/2020: ALT 39; AST 31; Hemoglobin 15.3; Platelets 150; TSH 3.518 11/07/2020: BUN 25; Creatinine, Ser 1.40; Magnesium 2.0; Potassium 3.9; Sodium 137   Other Studies Reviewed Today:  Echocardiogram 11/06/2020:  1. Left ventricular ejection fraction, by estimation, is 50 to 55%. The  left ventricle has low normal function. The left ventricle has no regional  wall motion abnormalities. There is moderate left ventricular hypertrophy.  Left ventricular  diastolic  parameters are indeterminate in the setting of atrial flutter.   2. Right ventricular systolic function is low normal. The right  ventricular size is normal. Tricuspid regurgitation signal is inadequate  for assessing PA pressure.   3. The mitral valve is grossly normal. Trivial mitral valve  regurgitation.   4. The aortic valve is tricuspid. Aortic valve regurgitation is not  visualized. No aortic stenosis is present. Aortic valve mean gradient  measures 3.3 mmHg.   5. The inferior vena cava is normal in size with greater than 50%  respiratory variability, suggesting right atrial pressure of 3 mmHg.   Assessment and Plan:    Medication Adjustments/Labs and Tests Ordered: Current medicines are reviewed at length with the patient today.  Concerns regarding medicines are outlined above.   Tests Ordered: No orders of the defined types were placed in this encounter.   Medication Changes: No orders of the defined types were placed in this encounter.   Disposition:  Follow up {follow up:15908}  Signed, Mitchell Nichols, [facility] 12/24/2020 8:15 AM    [facility] [address] [place], [place], [place] [address] Phone: [phone]; Fax: [phone]

## 2021-01-07 ENCOUNTER — Telehealth: Payer: Self-pay | Admitting: Cardiology

## 2021-01-07 MED ORDER — METOPROLOL SUCCINATE ER 50 MG PO TB24: 50.0000 mg | ORAL_TABLET | Freq: Every day | ORAL | 5 refills | Status: DC

## 2021-01-07 MED ORDER — APIXABAN 5 MG PO TABS: 5.0000 mg | ORAL_TABLET | Freq: Two times a day (BID) | ORAL | 5 refills | Status: DC

## 2021-01-07 NOTE — Telephone Encounter (Signed)
°*  STAT* If patient is at the pharmacy, call can be transferred to refill team.   1. Which medications need to be refilled? (please list name of each medication and dose if known)  apixaban (ELIQUIS) 5 MG TABS tablet metoprolol succinate (TOPROL-XL) 50 MG 24 hr tablet  2. Which pharmacy/location (including street and city if local pharmacy) is medication to be sent to? [facility] [place], [place] - [address]  3. Do they need a 30 day or 90 day supply? 30 DS

## 2021-01-07 NOTE — Telephone Encounter (Signed)
Prescription refill request for Eliquis received. Indication: Atrial flutter Last office visit: 11/25/20  Paralee Cancel NP Scr: 1.40 on 11/07/20 Age: 56 Weight: 128.8kg  Based on above findings Eliquis 5mg  twice daily is the appropriate dose.  Refill approved.

## 2021-01-23 ENCOUNTER — Institutional Professional Consult (permissible substitution): Payer: 59 | Admitting: Pulmonary Disease

## 2021-03-04 ENCOUNTER — Encounter (HOSPITAL_COMMUNITY): Payer: Self-pay | Admitting: Emergency Medicine

## 2021-03-04 ENCOUNTER — Other Ambulatory Visit: Payer: Self-pay

## 2021-03-04 ENCOUNTER — Emergency Department (HOSPITAL_COMMUNITY): Payer: 59

## 2021-03-04 ENCOUNTER — Emergency Department (HOSPITAL_COMMUNITY)
Admission: EM | Admit: 2021-03-04 | Discharge: 2021-03-04 | Disposition: A | Discharge status: Home / Self Care | Payer: 59 | Attending: Emergency Medicine | Admitting: Emergency Medicine

## 2021-03-04 ENCOUNTER — Encounter: Payer: Self-pay | Admitting: Cardiology

## 2021-03-04 ENCOUNTER — Ambulatory Visit (INDEPENDENT_AMBULATORY_CARE_PROVIDER_SITE_OTHER): Payer: 59 | Admitting: Cardiology

## 2021-03-04 VITALS — BP 148/100 | HR 74 | Ht 71.5 in | Wt 289.8 lb

## 2021-03-04 DIAGNOSIS — Z79899 Other long term (current) drug therapy: Secondary | ICD-10-CM | POA: Diagnosis not present

## 2021-03-04 DIAGNOSIS — I4891 Unspecified atrial fibrillation: Secondary | ICD-10-CM | POA: Diagnosis not present

## 2021-03-04 DIAGNOSIS — Z7901 Long term (current) use of anticoagulants: Secondary | ICD-10-CM | POA: Insufficient documentation

## 2021-03-04 DIAGNOSIS — I10 Essential (primary) hypertension: Secondary | ICD-10-CM

## 2021-03-04 DIAGNOSIS — I483 Typical atrial flutter: Secondary | ICD-10-CM | POA: Diagnosis not present

## 2021-03-04 DIAGNOSIS — I4892 Unspecified atrial flutter: Secondary | ICD-10-CM

## 2021-03-04 DIAGNOSIS — R519 Headache, unspecified: Secondary | ICD-10-CM

## 2021-03-04 DIAGNOSIS — R29818 Other symptoms and signs involving the nervous system: Secondary | ICD-10-CM

## 2021-03-04 DIAGNOSIS — I160 Hypertensive urgency: Secondary | ICD-10-CM

## 2021-03-04 LAB — CBC WITH DIFFERENTIAL/PLATELET
Abs Immature Granulocytes: 0.05 10*3/uL (ref 0.00–0.07)
Basophils Absolute: 0.1 10*3/uL (ref 0.0–0.1)
Basophils Relative: 1 %
Eosinophils Absolute: 0.4 10*3/uL (ref 0.0–0.5)
Eosinophils Relative: 5 %
HCT: 45.3 % (ref 39.0–52.0)
Hemoglobin: 14.5 g/dL (ref 13.0–17.0)
Immature Granulocytes: 1 %
Lymphocytes Relative: 27 %
Lymphs Abs: 2.4 10*3/uL (ref 0.7–4.0)
MCH: 31.7 pg (ref 26.0–34.0)
MCHC: 32 g/dL (ref 30.0–36.0)
MCV: 98.9 fL (ref 80.0–100.0)
Monocytes Absolute: 1 10*3/uL (ref 0.1–1.0)
Monocytes Relative: 11 %
Neutro Abs: 5.1 10*3/uL (ref 1.7–7.7)
Neutrophils Relative %: 55 %
Platelets: 158 10*3/uL (ref 150–400)
RBC: 4.58 MIL/uL (ref 4.22–5.81)
RDW: 13.2 % (ref 11.5–15.5)
WBC: 9.1 10*3/uL (ref 4.0–10.5)
nRBC: 0 % (ref 0.0–0.2)

## 2021-03-04 LAB — BASIC METABOLIC PANEL
Anion gap: 7 (ref 5–15)
BUN: 22 mg/dL — ABNORMAL HIGH (ref 6–20)
CO2: 28 mmol/L (ref 22–32)
Calcium: 8.6 mg/dL — ABNORMAL LOW (ref 8.9–10.3)
Chloride: 103 mmol/L (ref 98–111)
Creatinine, Ser: 1.2 mg/dL (ref 0.61–1.24)
GFR, Estimated: >60 mL/min (ref >60)
Glucose, Bld: 108 mg/dL — ABNORMAL HIGH (ref 70–99)
Potassium: 4.2 mmol/L (ref 3.5–5.1)
Sodium: 138 mmol/L (ref 135–145)

## 2021-03-04 MED ORDER — CLONIDINE HCL 0.2 MG PO TABS
0.2000 mg | ORAL_TABLET | Freq: Once | ORAL | Status: AC
  Administered 2021-03-04: 0.2 mg via ORAL
  Filled 2021-03-04: qty 1

## 2021-03-04 MED ORDER — LISINOPRIL 20 MG PO TABS: 20.0000 mg | ORAL_TABLET | Freq: Two times a day (BID) | ORAL | 2 refills | Status: DC

## 2021-03-04 NOTE — Progress Notes (Signed)
Cardiology Office Note  Date: 03/04/2021   ID: Mitchell Nichols, DOB 11/17/1965, MRN [id number]  PCP:  Adaline Sill, NP  Cardiologist:  Rozann Lesches, MD Electrophysiologist:  None   Chief Complaint  Patient presents with   Cardiac follow-up    History of Present Illness: Mitchell Nichols is a 56 y.o. male last seen in November 2022 by Mr. Sharolyn Douglas NP, I reviewed the note.  This is our first meeting in the office, I saw him during hospitalization at [facility] with atrial flutter.  Per chart review he was actually seen within the last 24 hours in the emergency department at [facility] complaining of headache and elevated blood pressure.  Head CT revealed no acute process.  Follow-up ECG showed that he was in sinus rhythm.  Lab work is noted below.  He has pending evaluation with [facility] for OSA assessment.  States that he has headaches in the mornings when he gets up.  He reports compliance with his medications.  We discussed up titration of lisinopril for now.  Suspect that weight loss and treatment for OSA will be beneficial as well.  I did review his lab work, creatinine 1.2 with potassium 4.2.  He does not report any bleeding problems on Eliquis.  Past Medical History:  Diagnosis Date   Atrial flutter ([place])    a. 11/2020 s/p TEE/DCCV (120J x 1); b. CHA2DS2VASc = 1-->Eliquis.   CKD (chronic kidney disease), stage II- III (HCC)    Hernia    Hypertension    Kidney stones    LVH (left ventricular hypertrophy)    a. 11/2020 Echo: EF 50-55%, no rwma, mod LVH. Nl RV size/fxn. Triv MR.   Morbid obesity ([place])    Snores     Past Surgical History:  Procedure Laterality Date   CARDIOVERSION N/A 11/07/2020   Procedure: CARDIOVERSION;  Surgeon: Satira Sark, MD;  Location: [facility];  Service: Cardiovascular;  Laterality: N/A;   HAND SURGERY Left    HERNIA REPAIR     KNEE SURGERY     TEE WITHOUT CARDIOVERSION N/A 11/07/2020   Procedure: TRANSESOPHAGEAL  ECHOCARDIOGRAM (TEE);  Surgeon: Satira Sark, MD;  Location: [facility];  Service: Cardiovascular;  Laterality: N/A;    Current Outpatient Medications  Medication Sig Dispense Refill   apixaban (ELIQUIS) 5 MG TABS tablet Take 1 tablet (5 mg total) by mouth 2 (two) times daily. 60 tablet 5   metoprolol succinate (TOPROL-XL) 50 MG 24 hr tablet Take 1 tablet (50 mg total) by mouth daily. Take with or immediately following a meal. 30 tablet 5   lisinopril (ZESTRIL) 20 MG tablet Take 1 tablet (20 mg total) by mouth 2 (two) times daily. 180 tablet 2   No current facility-administered medications for this visit.   Allergies:  Patient has no known allergies.   ROS: No palpitations or syncope.  Physical Exam: VS:  BP (!) 148/100    Pulse 74    Ht 5' 11.5" (1.816 m)    Wt 289 lb 12.8 oz (131.5 kg)    SpO2 94%    BMI 39.86 kg/m , BMI Body mass index is 39.86 kg/m.  Wt Readings from Last 3 Encounters:  03/04/21 289 lb 12.8 oz (131.5 kg)  03/04/21 284 lb (128.8 kg)  11/25/20 284 lb (128.8 kg)    General: Patient appears comfortable at rest. HEENT: Conjunctiva and lids normal, wearing a mask. Neck: Supple, no elevated JVP or carotid bruits, no thyromegaly.  Lungs: Clear to auscultation, nonlabored breathing at rest. Cardiac: Regular rate and rhythm, no S3 or significant systolic murmur, no pericardial rub. Abdomen: Protuberant, bowel sounds present. Extremities: No pitting edema.  ECG:  An ECG dated 03/04/2021 was personally reviewed today and demonstrated:  Sinus rhythm.  Recent Labwork: 11/06/2020: ALT 39; AST 31; TSH 3.518 11/07/2020: Magnesium 2.0 03/04/2021: BUN 22; Creatinine, Ser 1.20; Hemoglobin 14.5; Platelets 158; Potassium 4.2; Sodium 138   Other Studies Reviewed Today:  Echocardiogram 11/06/2020:  1. Left ventricular ejection fraction, by estimation, is 50 to 55%. The  left ventricle has low normal function. The left ventricle has no regional  wall motion abnormalities. There  is moderate left ventricular hypertrophy.  Left ventricular diastolic  parameters are indeterminate in the setting of atrial flutter.   2. Right ventricular systolic function is low normal. The right  ventricular size is normal. Tricuspid regurgitation signal is inadequate  for assessing PA pressure.   3. The mitral valve is grossly normal. Trivial mitral valve  regurgitation.   4. The aortic valve is tricuspid. Aortic valve regurgitation is not  visualized. No aortic stenosis is present. Aortic valve mean gradient  measures 3.3 mmHg.   5. The inferior vena cava is normal in size with greater than 50%  respiratory variability, suggesting right atrial pressure of 3 mmHg.   Assessment and Plan:  1.  Essential hypertension, blood pressure not optimally controlled.  Continue Toprol-XL 50 mg daily, increase lisinopril to 20 mg twice daily.  Follow-up BMET in 2 weeks.  2.  At risk for OSA, also likely contributing to his headaches and poor blood pressure control.  He has pending evaluation with [facility] in March.  3.  History of typical atrial flutter status post successful cardioversion in November 2022.  He remains on Eliquis for stroke prophylaxis, no interval palpitations.  He is in sinus rhythm by ECG and on Toprol-XL.  Medication Adjustments/Labs and Tests Ordered: Current medicines are reviewed at length with the patient today.  Concerns regarding medicines are outlined above.   Tests Ordered: Orders Placed This Encounter  Procedures   Basic metabolic panel   EKG [id number]    Medication Changes: Meds ordered this encounter  Medications   lisinopril (ZESTRIL) 20 MG tablet    Sig: Take 1 tablet (20 mg total) by mouth 2 (two) times daily.    Dispense:  180 tablet    Refill:  2    03/04/2021 dose increase    Disposition:  Follow up  3 months.  Signed, Satira Sark, MD, [facility] 03/04/2021 3:49 PM    [facility] [place], [place], [place] [address] Phone: [phone]; Fax: [phone]

## 2021-03-04 NOTE — Patient Instructions (Addendum)
Medication Instructions:  Your physician has recommended you make the following change in your medication:  Increase lisinopril 20 mg to twice daily Continue other medications the same  Labwork: BMET in 2 weeks Non-fasting [facility] or [employer] or [facility]  Testing/Procedures: none  Follow-Up: Your physician recommends that you schedule a follow-up appointment in: 3 months  Any Other Special Instructions Will Be Listed Below (If Applicable).  If you need a refill on your cardiac medications before your next appointment, please call your pharmacy.

## 2021-03-04 NOTE — ED Triage Notes (Signed)
Pt states bp has been high tonight and c/o headache.

## 2021-03-04 NOTE — Discharge Instructions (Signed)
Continue medications as previously prescribed.  Follow-up with your cardiologist tomorrow as scheduled, and return to the ER if symptoms significantly worsen or change.

## 2021-03-04 NOTE — ED Provider Notes (Signed)
[facility] EMERGENCY DEPARTMENT Provider Note   CSN: [id number] Arrival date & time: 03/04/21  0020     History  Chief Complaint  Patient presents with   Hypertension    Mitchell Nichols is a 56 y.o. male.  Patient is a 56 year old male with past medical history of hypertension, and recently diagnosed atrial fibrillation.  Patient taking Eliquis.  He presents today with complaints of elevated blood pressure and headache.  This started earlier today.  He describes a pressure to the top of his head and blood pressures at home measured over 200.  He denies any chest pain or difficulty breathing.  He denies any palpitations.  He reports being compliant with his blood pressure medications, but does report increased sodium intake the previous day.  The history is provided by the patient.      Home Medications Prior to Admission medications   Medication Sig Start Date End Date Taking? Authorizing Provider  apixaban (ELIQUIS) 5 MG TABS tablet Take 1 tablet (5 mg total) by mouth 2 (two) times daily. 01/07/21   Satira Sark, MD  lisinopril (ZESTRIL) 20 MG tablet Take 1 tablet (20 mg total) by mouth daily. 11/25/20 02/23/21  Theora Gianotti, NP  metoprolol succinate (TOPROL-XL) 50 MG 24 hr tablet Take 1 tablet (50 mg total) by mouth daily. Take with or immediately following a meal. 01/07/21   Satira Sark, MD      Allergies    Patient has no known allergies.    Review of Systems   Review of Systems  All other systems reviewed and are negative.  Physical Exam Updated Vital Signs BP (!) 169/99    Pulse 70    Temp (!) 97.5 F (36.4 C) (Oral)    Resp 19    Ht 5\' 11"  (1.803 m)    Wt 128.8 kg    SpO2 93%    BMI 39.61 kg/m  Physical Exam Vitals and nursing note reviewed.  Constitutional:      General: He is not in acute distress.    Appearance: He is well-developed. He is not diaphoretic.  HENT:     Head: Normocephalic and atraumatic.  Eyes:     Extraocular Movements:  Extraocular movements intact.     Pupils: Pupils are equal, round, and reactive to light.  Cardiovascular:     Rate and Rhythm: Normal rate and regular rhythm.     Heart sounds: No murmur heard.   No friction rub.  Pulmonary:     Effort: Pulmonary effort is normal. No respiratory distress.     Breath sounds: Normal breath sounds. No wheezing or rales.  Abdominal:     General: Bowel sounds are normal. There is no distension.     Palpations: Abdomen is soft.     Tenderness: There is no abdominal tenderness.  Musculoskeletal:        General: Normal range of motion.     Cervical back: Normal range of motion and neck supple.  Skin:    General: Skin is warm and dry.  Neurological:     General: No focal deficit present.     Mental Status: He is alert and oriented to person, place, and time.     Cranial Nerves: No cranial nerve deficit.     Motor: No weakness.     Coordination: Coordination normal.    ED Results / Procedures / Treatments   Labs (all labs ordered are listed, but only abnormal results are displayed) Labs Reviewed  BASIC METABOLIC PANEL  CBC WITH DIFFERENTIAL/PLATELET    EKG EKG Interpretation  Date/Time:  Tuesday March 04 2021 02:15:39 EST Ventricular Rate:  65 PR Interval:  200 QRS Duration: 92 QT Interval:  423 QTC Calculation: 440 R Axis:   86 Text Interpretation: Sinus rhythm Normal ECG Confirmed by Veryl Speak [phone]) on 03/04/2021 2:18:32 AM  Radiology No results found.  Procedures Procedures    Medications Ordered in ED Medications  cloNIDine (CATAPRES) tablet 0.2 mg (has no administration in time range)    ED Course/ Medical Decision Making/ A&P  This patient presents to the ED for concern of headache and elevated blood pressure, this involves an extensive number of treatment options, and is a complaint that carries with it a high risk of complications and morbidity.  The differential diagnosis includes subarachnoid hemorrhage,  hypertensive crisis, acute CVA   Co morbidities that complicate the patient evaluation  Atrial fibrillation on Eliquis   Additional history obtained:  No additional history or external records needed   Lab Tests:  I Ordered, and personally interpreted labs.  The pertinent results include: Unremarkable CBC, metabolic panel.   Imaging Studies ordered:  I ordered imaging studies including CT scan of the head I independently visualized and interpreted imaging which showed no acute process I agree with the radiologist interpretation   Cardiac Monitoring:  The patient was maintained on a cardiac monitor.  I personally viewed and interpreted the cardiac monitored which showed an underlying rhythm of: Sinus rhythm   Medicines ordered and prescription drug management:  I ordered medication including clonidine for elevated blood pressure Reevaluation of the patient after these medicines showed that the patient resolved I have reviewed the patients home medicines and have made adjustments as needed   Test Considered:  No other test considered   Critical Interventions:  Medication for hypertension   Consultations Obtained:  No consultations needed   Problem List / ED Course:  Patient presenting today with elevated blood pressure and headache.  This is of concern because patient was recently started on Eliquis after an episode of A-fib.  CT scan was obtained for this reason, showing no sign of stroke or hemorrhage.  Laboratory studies obtained showing no evidence for endorgan damage.  He was given a dose of clonidine and his blood pressure responded nicely.  His headache has since resolved.  At this point, I feel as though patient can safely be discharged.   Reevaluation:  After the interventions noted above, I reevaluated the patient and found that they have : Resolved   Social Determinants of Health:  None   Dispostion:  After consideration of the diagnostic  results and the patients response to treatment, I feel that the patent would benefit from discharge to home with continuing monitoring of his blood pressure.  He has an appointment tomorrow afternoon with his cardiologist.  I have advised him to mention this episode to his cardiologist to see if they feel the need to make changes to his blood pressure medications.    Final Clinical Impression(s) / ED Diagnoses Final diagnoses:  None    Rx / DC Orders ED Discharge Orders     None         Veryl Speak, MD 03/04/21 [phone]

## 2021-03-13 ENCOUNTER — Institutional Professional Consult (permissible substitution): Payer: 59 | Admitting: Pulmonary Disease

## 2021-04-04 ENCOUNTER — Encounter (HOSPITAL_COMMUNITY): Payer: Self-pay

## 2021-04-04 ENCOUNTER — Emergency Department (HOSPITAL_COMMUNITY): Payer: 59

## 2021-04-04 ENCOUNTER — Emergency Department (HOSPITAL_COMMUNITY)
Admission: EM | Admit: 2021-04-04 | Discharge: 2021-04-04 | Disposition: A | Discharge status: Home / Self Care | Payer: 59 | Attending: Emergency Medicine | Admitting: Emergency Medicine

## 2021-04-04 ENCOUNTER — Other Ambulatory Visit: Payer: Self-pay

## 2021-04-04 DIAGNOSIS — R5381 Other malaise: Secondary | ICD-10-CM | POA: Insufficient documentation

## 2021-04-04 DIAGNOSIS — R0602 Shortness of breath: Secondary | ICD-10-CM | POA: Diagnosis present

## 2021-04-04 DIAGNOSIS — Z79899 Other long term (current) drug therapy: Secondary | ICD-10-CM | POA: Diagnosis not present

## 2021-04-04 DIAGNOSIS — I129 Hypertensive chronic kidney disease with stage 1 through stage 4 chronic kidney disease, or unspecified chronic kidney disease: Secondary | ICD-10-CM | POA: Diagnosis not present

## 2021-04-04 DIAGNOSIS — I4891 Unspecified atrial fibrillation: Secondary | ICD-10-CM | POA: Insufficient documentation

## 2021-04-04 DIAGNOSIS — Z7901 Long term (current) use of anticoagulants: Secondary | ICD-10-CM | POA: Insufficient documentation

## 2021-04-04 DIAGNOSIS — R600 Localized edema: Secondary | ICD-10-CM | POA: Diagnosis not present

## 2021-04-04 DIAGNOSIS — R059 Cough, unspecified: Secondary | ICD-10-CM | POA: Insufficient documentation

## 2021-04-04 DIAGNOSIS — N189 Chronic kidney disease, unspecified: Secondary | ICD-10-CM | POA: Insufficient documentation

## 2021-04-04 DIAGNOSIS — N289 Disorder of kidney and ureter, unspecified: Secondary | ICD-10-CM

## 2021-04-04 LAB — CBC WITH DIFFERENTIAL/PLATELET
Abs Immature Granulocytes: 0.05 10*3/uL (ref 0.00–0.07)
Basophils Absolute: 0 10*3/uL (ref 0.0–0.1)
Basophils Relative: 1 %
Eosinophils Absolute: 0.4 10*3/uL (ref 0.0–0.5)
Eosinophils Relative: 5 %
HCT: 44.5 % (ref 39.0–52.0)
Hemoglobin: 14.2 g/dL (ref 13.0–17.0)
Immature Granulocytes: 1 %
Lymphocytes Relative: 25 %
Lymphs Abs: 1.8 10*3/uL (ref 0.7–4.0)
MCH: 32.1 pg (ref 26.0–34.0)
MCHC: 31.9 g/dL (ref 30.0–36.0)
MCV: 100.7 fL — ABNORMAL HIGH (ref 80.0–100.0)
Monocytes Absolute: 0.9 10*3/uL (ref 0.1–1.0)
Monocytes Relative: 13 %
Neutro Abs: 4.1 10*3/uL (ref 1.7–7.7)
Neutrophils Relative %: 55 %
Platelets: 144 10*3/uL — ABNORMAL LOW (ref 150–400)
RBC: 4.42 MIL/uL (ref 4.22–5.81)
RDW: 12.9 % (ref 11.5–15.5)
WBC: 7.3 10*3/uL (ref 4.0–10.5)
nRBC: 0 % (ref 0.0–0.2)

## 2021-04-04 LAB — BASIC METABOLIC PANEL
Anion gap: 6 (ref 5–15)
BUN: 21 mg/dL — ABNORMAL HIGH (ref 6–20)
CO2: 28 mmol/L (ref 22–32)
Calcium: 8.5 mg/dL — ABNORMAL LOW (ref 8.9–10.3)
Chloride: 106 mmol/L (ref 98–111)
Creatinine, Ser: 1.32 mg/dL — ABNORMAL HIGH (ref 0.61–1.24)
GFR, Estimated: >60 mL/min (ref >60)
Glucose, Bld: 131 mg/dL — ABNORMAL HIGH (ref 70–99)
Potassium: 4.2 mmol/L (ref 3.5–5.1)
Sodium: 140 mmol/L (ref 135–145)

## 2021-04-04 LAB — TROPONIN I (HIGH SENSITIVITY)
Troponin I (High Sensitivity): 7 ng/L (ref <18)
Troponin I (High Sensitivity): 7 ng/L (ref <18)

## 2021-04-04 LAB — BRAIN NATRIURETIC PEPTIDE: B Natriuretic Peptide: 69 pg/mL (ref 0.0–100.0)

## 2021-04-04 MED ORDER — ALBUTEROL SULFATE HFA 108 (90 BASE) MCG/ACT IN AERS: 2.0000 | INHALATION_SPRAY | RESPIRATORY_TRACT | 0 refills | Status: DC | PRN

## 2021-04-04 MED ORDER — IPRATROPIUM-ALBUTEROL 0.5-2.5 (3) MG/3ML IN SOLN
3.0000 mL | Freq: Once | RESPIRATORY_TRACT | Status: AC
  Administered 2021-04-04: 3 mL via RESPIRATORY_TRACT
  Filled 2021-04-04: qty 3

## 2021-04-04 MED ORDER — PREDNISONE 50 MG PO TABS: 50.0000 mg | ORAL_TABLET | Freq: Every day | ORAL | 0 refills | Status: DC

## 2021-04-04 MED ORDER — PREDNISONE 10 MG PO TABS
60.0000 mg | ORAL_TABLET | Freq: Once | ORAL | Status: AC
  Administered 2021-04-04: 60 mg via ORAL

## 2021-04-04 NOTE — ED Notes (Signed)
93% after ambulating to tx room  ?

## 2021-04-04 NOTE — ED Notes (Signed)
Unable to obtain blood from IV- phlebotomist notified of need for bloodwork ?

## 2021-04-04 NOTE — ED Provider Notes (Signed)
?[facility] EMERGENCY DEPARTMENT ?Provider Note ? ? ?CSN: [id number] ?Arrival date & time: 04/04/21  0244 ? ?  ? ?History ? ?Chief Complaint  ?Patient presents with  ? Shortness of Breath  ? ? ?Mitchell Nichols is a 56 y.o. male. ? ?The history is provided by the patient.  ?Shortness of Breath ?He has history of hypertension, atrial fibrillation/atrial flutter anticoagulated on apixaban, chronic kidney disease and comes in because he woke up with shortness of breath.  There has been a slight cough which is nonproductive.  He denies chest pain, heaviness, tightness, pressure.  He denies fever, chills, sweats.  He does admit to having had some malaise and perhaps a slight cough yesterday.  He had been in emergency for atrial fibrillation 5 months ago and states this does feel somewhat similar although he does not feel his heart racing. ?  ?Home Medications ?Prior to Admission medications   ?Medication Sig Start Date End Date Taking? Authorizing Provider  ?apixaban (ELIQUIS) 5 MG TABS tablet Take 1 tablet (5 mg total) by mouth 2 (two) times daily. 01/07/21   Jonelle Sidle, MD  ?lisinopril (ZESTRIL) 20 MG tablet Take 1 tablet (20 mg total) by mouth 2 (two) times daily. 03/04/21   Jonelle Sidle, MD  ?metoprolol succinate (TOPROL-XL) 50 MG 24 hr tablet Take 1 tablet (50 mg total) by mouth daily. Take with or immediately following a meal. 01/07/21   Jonelle Sidle, MD  ?   ? ?Allergies    ?Patient has no known allergies.   ? ?Review of Systems   ?Review of Systems  ?Respiratory:  Positive for shortness of breath.   ?All other systems reviewed and are negative. ? ?Physical Exam ?Updated Vital Signs ?BP (!) 166/102   Pulse 73   Temp 98.1 ?F (36.7 ?C) (Oral)   Resp 17   Ht 5\' 11"  (1.803 m)   Wt 131.5 kg   SpO2 95%   BMI 40.43 kg/m?  ?Physical Exam ?Vitals and nursing note reviewed.  ?56 year old male, resting comfortably and in no acute distress. Vital signs are significant for elevated blood pressure. Oxygen  saturation is 95%, which is normal. ?Head is normocephalic and atraumatic. PERRLA, EOMI. Oropharynx is clear. ?Neck is nontender and supple without adenopathy or JVD. ?Back is nontender and there is no CVA tenderness. ?Lungs have few bibasilar rales without wheezes or rhonchi. ?Chest is nontender. ?Heart has regular rate and rhythm without murmur. ?Abdomen is soft, flat, nontender without masses or hepatosplenomegaly and peristalsis is normoactive. ?Extremities have 1+ edema, full range of motion is present. ?Skin is warm and dry without rash. ?Neurologic: Mental status is normal, cranial nerves are intact, there are no motor or sensory deficits. ? ?ED Results / Procedures / Treatments   ?Labs ?(all labs ordered are listed, but only abnormal results are displayed) ?Labs Reviewed  ?BASIC METABOLIC PANEL - Abnormal; Notable for the following components:  ?    Result Value  ? Glucose, Bld 131 (*)   ? BUN 21 (*)   ? Creatinine, Ser 1.32 (*)   ? Calcium 8.5 (*)   ? All other components within normal limits  ?CBC WITH DIFFERENTIAL/PLATELET - Abnormal; Notable for the following components:  ? MCV 100.7 (*)   ? Platelets 144 (*)   ? All other components within normal limits  ?BRAIN NATRIURETIC PEPTIDE  ?TROPONIN I (HIGH SENSITIVITY)  ?TROPONIN I (HIGH SENSITIVITY)  ? ? ?EKG ?EKG Interpretation ? ?Date/Time:  Friday April 04 2021 03:03:37 EDT ?Ventricular Rate:  71 ?PR Interval:  182 ?QRS Duration: 95 ?QT Interval:  413 ?QTC Calculation: 449 ?R Axis:   92 ?Text Interpretation: Sinus rhythm Borderline right axis deviation Otherwise within normal limits When compared with ECG of 03/04/2021, No significant change was found Confirmed by Dione Booze ([id number]) on 04/04/2021 3:09:00 AM ? ?Radiology ?DG Chest 2 View ? ?Result Date: 04/04/2021 ?CLINICAL DATA:  Shortness of breath EXAM: CHEST - 2 VIEW COMPARISON:  11/05/2020 FINDINGS: Heart and mediastinal contours are within normal limits. No focal opacities or effusions. No acute bony  abnormality. IMPRESSION: No active cardiopulmonary disease. Electronically Signed   By: Charlett Nose M.D.   On: 04/04/2021 03:54   ? ?Procedures ?Procedures  ?Cardiac monitor shows normal sinus rhythm, per my interpretation. ? ?Medications Ordered in ED ?Medications  ?predniSONE (DELTASONE) tablet 60 mg (has no administration in time range)  ?ipratropium-albuterol (DUONEB) 0.5-2.5 (3) MG/3ML nebulizer solution 3 mL (3 mLs Nebulization Given 04/04/21 0521)  ? ? ?ED Course/ Medical Decision Making/ A&P ?  ?                        ?Medical Decision Making ?Amount and/or Complexity of Data Reviewed ?Labs: ordered. ?Radiology: ordered. ? ?Risk ?Prescription drug management. ? ? ?Acute dyspnea with exam suggestive of heart failure.  Considered pneumonia, bronchospasm, ACS or angina equivalent.  Doubt pulmonary embolism as patient is already anticoagulated.  ECG shows sinus rhythm, no ST or T changes.  We will check chest x-ray, troponin, BNP, metabolic panel.  Old records are reviewed, and he was hospitalized for atrial flutter on 11/05/2020.  Echocardiogram at that time showed ejection fraction 50-55% but diastolic parameters were indeterminate because of his being in atrial fibrillation. ? ?Chest x-ray shows no evidence of pneumonia.  I have independently viewed the images, and agree with radiologist's interpretation.  Labs do show mild renal insufficiency which is unchanged from baseline.  BNP is normal and troponin is normal x2.  Cause for dyspnea is not clear.  We will give therapeutic trial of nebulizer treatment with albuterol and ipratropium. ? ?Following nebulizer treatment, patient states that he feels much better.  This apparently is an episode of bronchospasm.  He is given a dose of prednisone and discharged with prescriptions for prednisone and albuterol inhaler.  Follow-up with PCP. ? ?Final Clinical Impression(s) / ED Diagnoses ?Final diagnoses:  ?SOB (shortness of breath)  ?Renal insufficiency  ?Chronic  anticoagulation  ? ? ?Rx / DC Orders ?ED Discharge Orders   ? ?      Ordered  ?  predniSONE (DELTASONE) 50 MG tablet  Daily       ? 04/04/21 0609  ?  albuterol (VENTOLIN HFA) 108 (90 Base) MCG/ACT inhaler  Every 4 hours PRN       ? 04/04/21 0609  ? ?  ?  ? ?  ? ? ?  ?Dione Booze, MD ?04/04/21 [phone] ? ?

## 2021-04-04 NOTE — ED Notes (Signed)
Lab at bedside drawing blood.

## 2021-04-04 NOTE — Discharge Instructions (Addendum)
Use the inhaler-2 puffs every 4 hours as needed.  Return if symptoms are not being adequately controlled at home. ?

## 2021-04-04 NOTE — ED Triage Notes (Signed)
Pt presents to ED with c/o sob that awoke from sleep, reports some tightness in chest and left arm pain. Reports dx with A-fib in Nov 2022 and is compliant with meds per pt.  ?

## 2021-06-04 ENCOUNTER — Encounter: Payer: Self-pay | Admitting: Cardiology

## 2021-06-04 ENCOUNTER — Ambulatory Visit (INDEPENDENT_AMBULATORY_CARE_PROVIDER_SITE_OTHER): Payer: 59 | Admitting: Cardiology

## 2021-06-04 VITALS — BP 160/102 | HR 69 | Ht 71.5 in | Wt 288.8 lb

## 2021-06-04 DIAGNOSIS — I10 Essential (primary) hypertension: Secondary | ICD-10-CM

## 2021-06-04 DIAGNOSIS — Z9189 Other specified personal risk factors, not elsewhere classified: Secondary | ICD-10-CM

## 2021-06-04 DIAGNOSIS — I483 Typical atrial flutter: Secondary | ICD-10-CM | POA: Diagnosis not present

## 2021-06-04 MED ORDER — AMLODIPINE BESYLATE 5 MG PO TABS: 5.0000 mg | ORAL_TABLET | Freq: Every day | ORAL | 2 refills | Status: DC

## 2021-06-04 NOTE — Patient Instructions (Addendum)
Medication Instructions:  Your physician has recommended you make the following change in your medication:  Start amlodipine 5 mg daily Continue other medications the same  Labwork: none  Testing/Procedures: none  Follow-Up: Your physician recommends that you schedule a follow-up appointment in: 6 months  Any Other Special Instructions Will Be Listed Below (If Applicable).  If you need a refill on your cardiac medications before your next appointment, please call your pharmacy. 

## 2021-06-04 NOTE — Progress Notes (Signed)
Cardiology Office Note  Date: 06/04/2021   ID: [id number] PEZZI, DOB Aug 31, 1965, MRN [id number]  PCP:  Adaline Sill, NP  Cardiologist:  Rozann Lesches, MD Electrophysiologist:  None   Chief Complaint  Patient presents with   Cardiac follow-up    History of Present Illness: Mitchell Nichols is a 56 y.o. male last seen in February.  He is here for a follow-up visit.  Reports no palpitations since last assessment.  His blood pressure remains elevated and he reports compliance with lisinopril and Toprol-XL at current doses.  He has not yet undergone evaluation for sleep apnea, I encouraged him to follow through on this.  He does not report any bleeding problems on Eliquis.  I reviewed his lab work and ECG from March as noted below.  We discussed addition of Norvasc to his antihypertensive regimen.  Past Medical History:  Diagnosis Date   Atrial flutter ([place])    a. 11/2020 s/p TEE/DCCV (120J x 1); b. CHA2DS2VASc = 1-->Eliquis.   CKD (chronic kidney disease), stage II- III (HCC)    Hernia    Hypertension    Kidney stones    LVH (left ventricular hypertrophy)    a. 11/2020 Echo: EF 50-55%, no rwma, mod LVH. Nl RV size/fxn. Triv MR.   Morbid obesity ([place])    Snores     Past Surgical History:  Procedure Laterality Date   CARDIOVERSION N/A 11/07/2020   Procedure: CARDIOVERSION;  Surgeon: Satira Sark, MD;  Location: [facility];  Service: Cardiovascular;  Laterality: N/A;   HAND SURGERY Left    HERNIA REPAIR     KNEE SURGERY     TEE WITHOUT CARDIOVERSION N/A 11/07/2020   Procedure: TRANSESOPHAGEAL ECHOCARDIOGRAM (TEE);  Surgeon: Satira Sark, MD;  Location: [facility];  Service: Cardiovascular;  Laterality: N/A;    Current Outpatient Medications  Medication Sig Dispense Refill   albuterol (VENTOLIN HFA) 108 (90 Base) MCG/ACT inhaler Inhale 2 puffs into the lungs every 4 (four) hours as needed for wheezing or shortness of breath. 1 each 0   amLODipine (NORVASC) 5 MG  tablet Take 1 tablet (5 mg total) by mouth daily. 90 tablet 2   apixaban (ELIQUIS) 5 MG TABS tablet Take 1 tablet (5 mg total) by mouth 2 (two) times daily. 60 tablet 5   lisinopril (ZESTRIL) 20 MG tablet Take 1 tablet (20 mg total) by mouth 2 (two) times daily. 180 tablet 2   metoprolol succinate (TOPROL-XL) 50 MG 24 hr tablet Take 1 tablet (50 mg total) by mouth daily. Take with or immediately following a meal. 30 tablet 5   No current facility-administered medications for this visit.   Allergies:  Patient has no known allergies.   ROS:  No syncope.  Physical Exam: VS:  BP (!) 160/102   Pulse 69   Ht 5' 11.5" (1.816 m)   Wt 288 lb 12.8 oz (131 kg)   SpO2 94%   BMI 39.72 kg/m , BMI Body mass index is 39.72 kg/m.  Wt Readings from Last 3 Encounters:  06/04/21 288 lb 12.8 oz (131 kg)  04/04/21 289 lb 14.5 oz (131.5 kg)  03/04/21 289 lb 12.8 oz (131.5 kg)    General: Patient appears comfortable at rest. HEENT: Conjunctiva and lids normal. Neck: Supple, no elevated JVP or carotid bruits, no thyromegaly. Lungs: Clear to auscultation, nonlabored breathing at rest. Cardiac: Regular rate and rhythm, no S3 or significant systolic murmur, no pericardial rub. Extremities: No pitting edema.  ECG:  An ECG dated 04/04/2021 was personally reviewed today and demonstrated:  Sinus rhythm.  Recent Labwork: 11/06/2020: ALT 39; AST 31; TSH 3.518 11/07/2020: Magnesium 2.0 04/04/2021: B Natriuretic Peptide 69.0; BUN 21; Creatinine, Ser 1.32; Hemoglobin 14.2; Platelets 144; Potassium 4.2; Sodium 140, high-sensitivity troponin I levels normal  Other Studies Reviewed Today:  Echocardiogram 11/06/2020:  1. Left ventricular ejection fraction, by estimation, is 50 to 55%. The  left ventricle has low normal function. The left ventricle has no regional  wall motion abnormalities. There is moderate left ventricular hypertrophy.  Left ventricular diastolic  parameters are indeterminate in the setting of  atrial flutter.   2. Right ventricular systolic function is low normal. The right  ventricular size is normal. Tricuspid regurgitation signal is inadequate  for assessing PA pressure.   3. The mitral valve is grossly normal. Trivial mitral valve  regurgitation.   4. The aortic valve is tricuspid. Aortic valve regurgitation is not  visualized. No aortic stenosis is present. Aortic valve mean gradient  measures 3.3 mmHg.   5. The inferior vena cava is normal in size with greater than 50%  respiratory variability, suggesting right atrial pressure of 3 mmHg.   Chest x-ray 04/04/2021: FINDINGS: Heart and mediastinal contours are within normal limits. No focal opacities or effusions. No acute bony abnormality.   IMPRESSION: No active cardiopulmonary disease.  Assessment and Plan:  1.  History of typical atrial flutter status post cardioversion in November 2022, no obvious recurrences.  He remains on Eliquis for stroke prophylaxis with CHA2DS2-VASc score of at least 1.  Continue Toprol-XL.  Interval lab work and ECG reviewed.  2.  Essential hypertension.  Continue current doses of lisinopril and Toprol-XL.  Adding Norvasc 5 mg daily.  3.  At risk for OSA.  Encouraged him to follow through for evaluation of sleep apnea.  Medication Adjustments/Labs and Tests Ordered: Current medicines are reviewed at length with the patient today.  Concerns regarding medicines are outlined above.   Tests Ordered: No orders of the defined types were placed in this encounter.   Medication Changes: Meds ordered this encounter  Medications   amLODipine (NORVASC) 5 MG tablet    Sig: Take 1 tablet (5 mg total) by mouth daily.    Dispense:  90 tablet    Refill:  2    06/04/2021 NEW    Disposition:  Follow up  6 months.  Signed, Satira Sark, MD, [facility] 06/04/2021 4:00 PM    [facility], [place], [place] [address] Phone: [phone]; Fax: [phone]

## 2021-06-06 ENCOUNTER — Other Ambulatory Visit: Payer: Self-pay | Admitting: Cardiology

## 2021-06-10 ENCOUNTER — Other Ambulatory Visit: Payer: Self-pay | Admitting: Cardiology

## 2021-06-10 NOTE — Telephone Encounter (Signed)
Prescription refill request for Eliquis received. Indication: Atrial Flutter Last office visit: 06/04/21  Ival Bible MD Scr: 1.32 on 03/05/21 Age: 56 Weight: 131kg  Based on above findings Eliquis 5mg  twice daily is the appropriate dose.  Refill approved.

## 2021-06-26 ENCOUNTER — Ambulatory Visit: Payer: 59 | Admitting: Orthopaedic Surgery

## 2021-06-26 ENCOUNTER — Ambulatory Visit (INDEPENDENT_AMBULATORY_CARE_PROVIDER_SITE_OTHER): Payer: 59

## 2021-06-26 VITALS — Ht 71.0 in | Wt 288.0 lb

## 2021-06-26 DIAGNOSIS — G8929 Other chronic pain: Secondary | ICD-10-CM | POA: Diagnosis not present

## 2021-06-26 DIAGNOSIS — M1711 Unilateral primary osteoarthritis, right knee: Secondary | ICD-10-CM | POA: Diagnosis not present

## 2021-06-26 DIAGNOSIS — M25561 Pain in right knee: Secondary | ICD-10-CM | POA: Diagnosis not present

## 2021-06-26 MED ORDER — METHYLPREDNISOLONE ACETATE 40 MG/ML IJ SUSP
40.0000 mg | INTRAMUSCULAR | Status: AC | PRN
  Administered 2021-06-26: 40 mg via INTRA_ARTICULAR

## 2021-06-26 MED ORDER — BUPIVACAINE HCL 0.25 % IJ SOLN
4.0000 mL | INTRAMUSCULAR | Status: AC | PRN
  Administered 2021-06-26: 4 mL via INTRA_ARTICULAR

## 2021-06-26 MED ORDER — LIDOCAINE HCL 1 % IJ SOLN
0.5000 mL | INTRAMUSCULAR | Status: AC | PRN
  Administered 2021-06-26: .5 mL

## 2021-07-23 ENCOUNTER — Emergency Department (HOSPITAL_COMMUNITY): Payer: 59

## 2021-07-23 ENCOUNTER — Other Ambulatory Visit: Payer: Self-pay

## 2021-07-23 ENCOUNTER — Encounter (HOSPITAL_COMMUNITY): Payer: Self-pay | Admitting: Emergency Medicine

## 2021-07-23 ENCOUNTER — Emergency Department (HOSPITAL_COMMUNITY)
Admission: EM | Admit: 2021-07-23 | Discharge: 2021-07-23 | Disposition: A | Discharge status: Home / Self Care | Payer: 59 | Attending: Emergency Medicine | Admitting: Emergency Medicine

## 2021-07-23 DIAGNOSIS — Z79899 Other long term (current) drug therapy: Secondary | ICD-10-CM | POA: Diagnosis not present

## 2021-07-23 DIAGNOSIS — I129 Hypertensive chronic kidney disease with stage 1 through stage 4 chronic kidney disease, or unspecified chronic kidney disease: Secondary | ICD-10-CM | POA: Insufficient documentation

## 2021-07-23 DIAGNOSIS — N189 Chronic kidney disease, unspecified: Secondary | ICD-10-CM | POA: Diagnosis not present

## 2021-07-23 DIAGNOSIS — S0990XA Unspecified injury of head, initial encounter: Secondary | ICD-10-CM | POA: Insufficient documentation

## 2021-07-23 DIAGNOSIS — W01198A Fall on same level from slipping, tripping and stumbling with subsequent striking against other object, initial encounter: Secondary | ICD-10-CM | POA: Insufficient documentation

## 2021-07-23 DIAGNOSIS — Z23 Encounter for immunization: Secondary | ICD-10-CM | POA: Insufficient documentation

## 2021-07-23 DIAGNOSIS — Z7901 Long term (current) use of anticoagulants: Secondary | ICD-10-CM | POA: Diagnosis not present

## 2021-07-23 MED ORDER — TETANUS-DIPHTH-ACELL PERTUSSIS 5-2.5-18.5 LF-MCG/0.5 IM SUSY
0.5000 mL | PREFILLED_SYRINGE | Freq: Once | INTRAMUSCULAR | Status: AC
  Administered 2021-07-23: 0.5 mL via INTRAMUSCULAR
  Filled 2021-07-23: qty 0.5

## 2021-07-23 NOTE — Discharge Instructions (Addendum)
Please return to ED with any new or worsening signs or symptoms Please follow-up with PCP for further management Please read attached guide concerning head injuries

## 2021-07-23 NOTE — ED Triage Notes (Signed)
Pt states he tripped and fell head striking the lawnmower. Pt is on eliquis.

## 2021-07-23 NOTE — ED Provider Notes (Signed)
[facility] EMERGENCY DEPARTMENT Provider Note   CSN: [id number] Arrival date & time: 07/23/21  2114     History  Chief Complaint  Patient presents with   Head Injury    Mitchell Nichols is a 56 y.o. male with medical history of atrial flutter, CKD, hernia, hypertension, LVH.  The patient presents to the ED for evaluation of fall.  Patient states prior to arrival he had mechanical ground-level fall where he struck the left posterior side of his scalp on a lawnmower.  The patient states that he is currently taking a blood thinner for A-fib.  The patient denies any loss of consciousness, nausea, vomiting, headache, neck pain, back pain.   Head Injury Associated symptoms: no headaches, no nausea, no neck pain and no vomiting        Home Medications Prior to Admission medications   Medication Sig Start Date End Date Taking? Authorizing Provider  albuterol (VENTOLIN HFA) 108 (90 Base) MCG/ACT inhaler Inhale 2 puffs into the lungs every 4 (four) hours as needed for wheezing or shortness of breath. 04/04/21   Dione Booze, MD  amLODipine (NORVASC) 5 MG tablet Take 1 tablet (5 mg total) by mouth daily. 06/04/21   Jonelle Sidle, MD  ELIQUIS 5 MG TABS tablet TAKE ONE (1) TABLET BY MOUTH TWO (2) TIMES DAILY 06/10/21   Jonelle Sidle, MD  lisinopril (ZESTRIL) 20 MG tablet Take 1 tablet (20 mg total) by mouth 2 (two) times daily. 03/04/21   Jonelle Sidle, MD  metoprolol succinate (TOPROL-XL) 50 MG 24 hr tablet TAKE 1 TABLET BY MOUTH DAILY WITH FOOD 06/06/21   Jonelle Sidle, MD      Allergies    Patient has no known allergies.    Review of Systems   Review of Systems  Gastrointestinal:  Negative for nausea and vomiting.  Musculoskeletal:  Negative for back pain and neck pain.  Skin:  Positive for wound.  Neurological:  Negative for syncope and headaches.  All other systems reviewed and are negative.   Physical Exam Updated Vital Signs BP 121/82   Pulse 77   Temp 97.7  F (36.5 C) (Oral)   Resp 18   Ht 5\' 11"  (1.803 m)   Wt 127 kg   SpO2 93%   BMI 39.05 kg/m  Physical Exam Vitals and nursing note reviewed.  Constitutional:      General: He is not in acute distress.    Appearance: Normal appearance. He is not ill-appearing, toxic-appearing or diaphoretic.  HENT:     Head: Normocephalic and atraumatic.     Nose: Nose normal.     Mouth/Throat:     Mouth: Mucous membranes are moist.     Pharynx: Oropharynx is clear.  Eyes:     Extraocular Movements: Extraocular movements intact.     Pupils: Pupils are equal, round, and reactive to light.  Cardiovascular:     Rate and Rhythm: Normal rate and regular rhythm.  Pulmonary:     Effort: Pulmonary effort is normal.     Breath sounds: Normal breath sounds. No wheezing.  Abdominal:     General: Abdomen is flat. Bowel sounds are normal.     Palpations: Abdomen is soft.     Tenderness: There is no abdominal tenderness.  Skin:    General: Skin is warm and dry.     Capillary Refill: Capillary refill takes less than 2 seconds.  Neurological:     Mental Status: He is alert and oriented  to person, place, and time.     GCS: GCS eye subscore is 4. GCS verbal subscore is 5. GCS motor subscore is 6.     Cranial Nerves: Cranial nerves 2-12 are intact. No cranial nerve deficit.     Sensory: Sensation is intact. No sensory deficit.     Motor: Motor function is intact. No weakness.     Coordination: Coordination is intact. Heel to [facility] Test normal.     ED Results / Procedures / Treatments   Labs (all labs ordered are listed, but only abnormal results are displayed) Labs Reviewed - No data to display  EKG None  Radiology CT Head Wo Contrast  Result Date: 07/23/2021 CLINICAL DATA:  Head trauma laceration EXAM: CT HEAD WITHOUT CONTRAST TECHNIQUE: Contiguous axial images were obtained from the base of the skull through the vertex without intravenous contrast. RADIATION DOSE REDUCTION: This exam was performed  according to the departmental dose-optimization program which includes automated exposure control, adjustment of the mA and/or kV according to patient size and/or use of iterative reconstruction technique. COMPARISON:  CT 03/04/2021 FINDINGS: Brain: No acute territorial infarction, hemorrhage or intracranial mass. The ventricles are nonenlarged. Vascular: No hyperdense vessels.  No unexpected calcification Skull: Normal. Negative for fracture or focal lesion. Sinuses/Orbits: No acute finding. Other: Large left posterior parietal scalp laceration and hematoma. IMPRESSION: 1. No CT evidence for acute intracranial abnormality. 2. Large left posterior scalp laceration and hematoma Electronically Signed   By: Jasmine Pang M.D.   On: 07/23/2021 23:10    Procedures Procedures   Medications Ordered in ED Medications  Tdap (BOOSTRIX) injection 0.5 mL (has no administration in time range)    ED Course/ Medical Decision Making/ A&P                           Medical Decision Making Amount and/or Complexity of Data Reviewed Radiology: ordered.  Risk Prescription drug management.   56 year old male presents to the ED for evaluation.  Please see HPI for further details.  On examination, the patient is afebrile and nontachycardic.  The patient lung sounds are clear bilaterally, he is not hypoxic on room air.  The patient abdomen is soft and compressible in all 4 quadrants.  The patient neurological examination shows no focal neurodeficits.  The patient does have a very small skin abrasion to the posterior left side of his scalp with controlled bleeding.  Patient worked up utilizing the following imaging studies interpreted by me personally: - CT head negative for any intracranial abnormality, midline shift, herniation  Patient tetanus booster updated.  At this time, the patient is stable for discharge.  The patient will be sent home and advised to follow-up with PCP for further management.  The  patient had all of his questions answered prior to discharge.  The patient was given strict return precautions and he voiced understanding these.  The patient is stable for discharge at this time.   Final Clinical Impression(s) / ED Diagnoses Final diagnoses:  Minor head injury without loss of consciousness, initial encounter    Rx / DC Orders ED Discharge Orders     None         Clent Ridges 07/23/21 2334    Derwood Kaplan, MD 07/24/21 [phone]

## 2021-10-01 ENCOUNTER — Telehealth: Payer: Self-pay | Admitting: Cardiology

## 2021-10-01 MED ORDER — APIXABAN 5 MG PO TABS: 5.0000 mg | ORAL_TABLET | Freq: Two times a day (BID) | ORAL | 0 refills | Status: DC

## 2021-10-01 NOTE — Telephone Encounter (Signed)
Patient calling the office for samples of medication:   1.  What medication and dosage are you requesting samples for? ELIQUIS 5 MG TABS tablet metoprolol succinate (TOPROL-XL) 50 MG 24 hr tablet lisinopril (ZESTRIL) 20 MG tablet  2.  Are you currently out of this medication?   Yes, patient states he is completely out--his insurance will not kick in until 10/01 and this morning his pharmacy advised Eliquis will currently cost him $550.

## 2021-10-01 NOTE — Telephone Encounter (Signed)
Patient walked into office inquiring about the samples.    Samples given.

## 2021-10-01 NOTE — Telephone Encounter (Signed)
Patient states that his new insurance will go into affect on 10/05/21 and until then, is requesting samples of Eliquis. States that he will check with his pharmacy for the cost of the metoprolol and lisinopril. Informed the patient these medications are available at [facility] (lisinopril 30 day/$4 and metoprolol succinate 30 day/$9). Will forward to coumadin nurse for the ok to give Eliquis samples.

## 2021-10-01 NOTE — Telephone Encounter (Signed)
Request received for Eliquis samples: Indication: A Flutter Last office visit: 06/04/21  Myles Gip MD Scr: 1.32 on 04/04/21 Age: 56 Weight: 131kg  Based on above findings Eliquis 5mg  twice daily is the appropriate dose.  OK to give samples if available.

## 2021-11-20 ENCOUNTER — Other Ambulatory Visit: Payer: Self-pay | Admitting: Cardiology

## 2021-12-10 ENCOUNTER — Encounter: Payer: Self-pay | Admitting: Cardiology

## 2021-12-10 ENCOUNTER — Ambulatory Visit: Payer: 59 | Attending: Cardiology | Admitting: Cardiology

## 2021-12-10 VITALS — BP 130/80 | HR 66 | Ht 71.5 in | Wt 292.6 lb

## 2021-12-10 DIAGNOSIS — I10 Essential (primary) hypertension: Secondary | ICD-10-CM

## 2021-12-10 DIAGNOSIS — I483 Typical atrial flutter: Secondary | ICD-10-CM

## 2021-12-10 NOTE — Progress Notes (Signed)
Cardiology Office Note  Date: 12/10/2021   ID: Mitchell Nichols, DOB April 11, 1965, MRN [id number]  PCP:  Rebekah Chesterfield, NP  Cardiologist:  [name], MD Electrophysiologist:  None   Chief Complaint  Patient presents with   Cardiac follow-up    History of Present Illness: Mitchell Nichols is a 56 y.o. male last seen in May.  He is here for a follow-up visit.  Reports no palpitations or increasing shortness of breath since last encounter.  Does have intermittent dependent mild lower leg edema.  I reviewed his medications which are stable from a cardiac perspective.  No reported bleeding problems on Eliquis.  His blood pressure is better controlled on combination of Norvasc and lisinopril.  Some of his leg edema could be due to Norvasc.  We discussed getting a follow-up visit with PCP for routine check and lab work.  LVEF approximately 50 to 55% with moderate LVH by echocardiogram in November of last year.  Past Medical History:  Diagnosis Date   Atrial flutter (HCC)    a. 11/2020 s/p TEE/DCCV (120J x 1); b. CHA2DS2VASc = 1-->Eliquis.   CKD (chronic kidney disease), stage II- III (HCC)    Hernia    Hypertension    Kidney stones    LVH (left ventricular hypertrophy)    a. 11/2020 Echo: EF 50-55%, no rwma, mod LVH. Nl RV size/fxn. Triv MR.   Morbid obesity (HCC)    Snores     Past Surgical History:  Procedure Laterality Date   CARDIOVERSION N/A 11/07/2020   Procedure: CARDIOVERSION;  Surgeon: Jonelle Sidle, MD;  Location: [facility];  Service: Cardiovascular;  Laterality: N/A;   HAND SURGERY Left    HERNIA REPAIR     KNEE SURGERY     TEE WITHOUT CARDIOVERSION N/A 11/07/2020   Procedure: TRANSESOPHAGEAL ECHOCARDIOGRAM (TEE);  Surgeon: Jonelle Sidle, MD;  Location: [facility];  Service: Cardiovascular;  Laterality: N/A;    Current Outpatient Medications  Medication Sig Dispense Refill   albuterol (VENTOLIN HFA) 108 (90 Base) MCG/ACT inhaler Inhale 2 puffs into  the lungs every 4 (four) hours as needed for wheezing or shortness of breath. 1 each 0   amLODipine (NORVASC) 5 MG tablet TAKE ONE (1) TABLET BY MOUTH EVERY DAY 90 tablet 2   apixaban (ELIQUIS) 5 MG TABS tablet Take 1 tablet (5 mg total) by mouth 2 (two) times daily. 42 tablet 0   lisinopril (ZESTRIL) 20 MG tablet Take 1 tablet (20 mg total) by mouth 2 (two) times daily. 180 tablet 2   metoprolol succinate (TOPROL-XL) 50 MG 24 hr tablet TAKE 1 TABLET BY MOUTH DAILY WITH FOOD 30 tablet 6   No current facility-administered medications for this visit.   Allergies:  Patient has no known allergies.   ROS: No dizziness or syncope.  Physical Exam: VS:  BP 130/80   Pulse 66   Ht 5' 11.5" (1.816 m)   Wt 292 lb 9.6 oz (132.7 kg)   SpO2 93%   BMI 40.24 kg/m , BMI Body mass index is 40.24 kg/m.  Wt Readings from Last 3 Encounters:  12/10/21 292 lb 9.6 oz (132.7 kg)  07/23/21 280 lb (127 kg)  06/26/21 288 lb (130.6 kg)    General: Patient appears comfortable at rest. HEENT: Conjunctiva and lids normal. Neck: Supple, no elevated JVP or carotid bruits. Lungs: Clear to auscultation, nonlabored breathing at rest. Cardiac: Regular rate and rhythm, no S3 or significant systolic murmur. Extremities: No pitting  edema.  ECG:  An ECG dated 04/04/2021 was personally reviewed today and demonstrated:  Sinus rhythm.  Recent Labwork: 04/04/2021: B Natriuretic Peptide 69.0; BUN 21; Creatinine, Ser 1.32; Hemoglobin 14.2; Platelets 144; Potassium 4.2; Sodium 140   Other Studies Reviewed Today:  Echocardiogram 11/06/2020:  1. Left ventricular ejection fraction, by estimation, is 50 to 55%. The  left ventricle has low normal function. The left ventricle has no regional  wall motion abnormalities. There is moderate left ventricular hypertrophy.  Left ventricular diastolic  parameters are indeterminate in the setting of atrial flutter.   2. Right ventricular systolic function is low normal. The right   ventricular size is normal. Tricuspid regurgitation signal is inadequate  for assessing PA pressure.   3. The mitral valve is grossly normal. Trivial mitral valve  regurgitation.   4. The aortic valve is tricuspid. Aortic valve regurgitation is not  visualized. No aortic stenosis is present. Aortic valve mean gradient  measures 3.3 mmHg.   5. The inferior vena cava is normal in size with greater than 50%  respiratory variability, suggesting right atrial pressure of 3 mmHg.   Assessment and Plan:  1.  Typical atrial flutter status post successful cardioversion in November 2022.  He has had no obvious recurrences or sense of palpitations in the interim.  Continue Toprol-XL along with Eliquis for stroke prophylaxis.  CHA2DS2-VASc score is at least 1.  Encouraged him to follow-up with PCP for routine lab work.  No reported spontaneous bleeding problems.  If recurrent arrhythmia becomes an issue, we will get him EP consultation regarding ablation.  2.  Essential hypertension, blood pressure much better controlled at this point.  In addition to Toprol-XL he is on lisinopril and Norvasc.  Keep follow-up with PCP.  Medication Adjustments/Labs and Tests Ordered: Current medicines are reviewed at length with the patient today.  Concerns regarding medicines are outlined above.   Tests Ordered: No orders of the defined types were placed in this encounter.   Medication Changes: No orders of the defined types were placed in this encounter.   Disposition:  Follow up  next months.  Signed, Jonelle Sidle, MD, [facility] 12/10/2021 1:09 PM    [facility] at [facility] [address] [place], [place], [place] [address] Phone: [phone]; Fax: [phone]

## 2021-12-10 NOTE — Patient Instructions (Addendum)

## 2021-12-20 ENCOUNTER — Other Ambulatory Visit: Payer: Self-pay | Admitting: Cardiology

## 2021-12-22 NOTE — Telephone Encounter (Signed)
Prescription refill request for Eliquis received. Indication: A Flutter Last office visit: 12/10/21  Ival Bible MD Scr: 1.32 on 04/04/21 Age: 56  Weight: 132.7kg  Based on above findings Eliquis 5mg  twice daily is the appropriate dose.  Refill approved.

## 2021-12-27 ENCOUNTER — Other Ambulatory Visit: Payer: Self-pay | Admitting: Cardiology

## 2022-03-03 DIAGNOSIS — I10 Essential (primary) hypertension: Secondary | ICD-10-CM | POA: Diagnosis not present

## 2022-03-03 DIAGNOSIS — J40 Bronchitis, not specified as acute or chronic: Secondary | ICD-10-CM | POA: Diagnosis not present

## 2022-03-03 DIAGNOSIS — I4891 Unspecified atrial fibrillation: Secondary | ICD-10-CM | POA: Diagnosis not present

## 2022-03-03 DIAGNOSIS — R600 Localized edema: Secondary | ICD-10-CM | POA: Diagnosis not present

## 2022-05-19 DIAGNOSIS — Z7901 Long term (current) use of anticoagulants: Secondary | ICD-10-CM | POA: Diagnosis not present

## 2022-05-19 DIAGNOSIS — Z809 Family history of malignant neoplasm, unspecified: Secondary | ICD-10-CM | POA: Diagnosis not present

## 2022-05-19 DIAGNOSIS — I4891 Unspecified atrial fibrillation: Secondary | ICD-10-CM | POA: Diagnosis not present

## 2022-05-19 DIAGNOSIS — R600 Localized edema: Secondary | ICD-10-CM | POA: Diagnosis not present

## 2022-05-19 DIAGNOSIS — I4892 Unspecified atrial flutter: Secondary | ICD-10-CM | POA: Diagnosis not present

## 2022-05-19 DIAGNOSIS — Z008 Encounter for other general examination: Secondary | ICD-10-CM | POA: Diagnosis not present

## 2022-05-19 DIAGNOSIS — Z825 Family history of asthma and other chronic lower respiratory diseases: Secondary | ICD-10-CM | POA: Diagnosis not present

## 2022-05-19 DIAGNOSIS — I10 Essential (primary) hypertension: Secondary | ICD-10-CM | POA: Diagnosis not present

## 2022-05-19 DIAGNOSIS — Z8249 Family history of ischemic heart disease and other diseases of the circulatory system: Secondary | ICD-10-CM | POA: Diagnosis not present

## 2022-05-19 DIAGNOSIS — G629 Polyneuropathy, unspecified: Secondary | ICD-10-CM | POA: Diagnosis not present

## 2022-05-19 DIAGNOSIS — Z6841 Body Mass Index (BMI) 40.0 and over, adult: Secondary | ICD-10-CM | POA: Diagnosis not present

## 2022-05-19 DIAGNOSIS — M1990 Unspecified osteoarthritis, unspecified site: Secondary | ICD-10-CM | POA: Diagnosis not present

## 2022-06-11 ENCOUNTER — Other Ambulatory Visit: Payer: Self-pay | Admitting: Cardiology

## 2022-06-11 NOTE — Telephone Encounter (Signed)
Prescription refill request for Eliquis received. Indication: A Flutter Last office visit: 12/10/21  S McDowell MD Scr: 1.32 on 04/04/21 Age: 56  Weight: 132.7kg  Based on above findings Eliquis 5mg twice daily is the appropriate dose.  Refill approved. 

## 2022-07-25 ENCOUNTER — Other Ambulatory Visit: Payer: Self-pay | Admitting: Cardiology

## 2022-08-18 IMAGING — DX DG CHEST 2V
2 series · 2 of 2 positions shown · non-contrast
Comparison: 11/05/2020

CLINICAL DATA: Shortness of breath

EXAM:
CHEST - 2 VIEW

[chest pa]
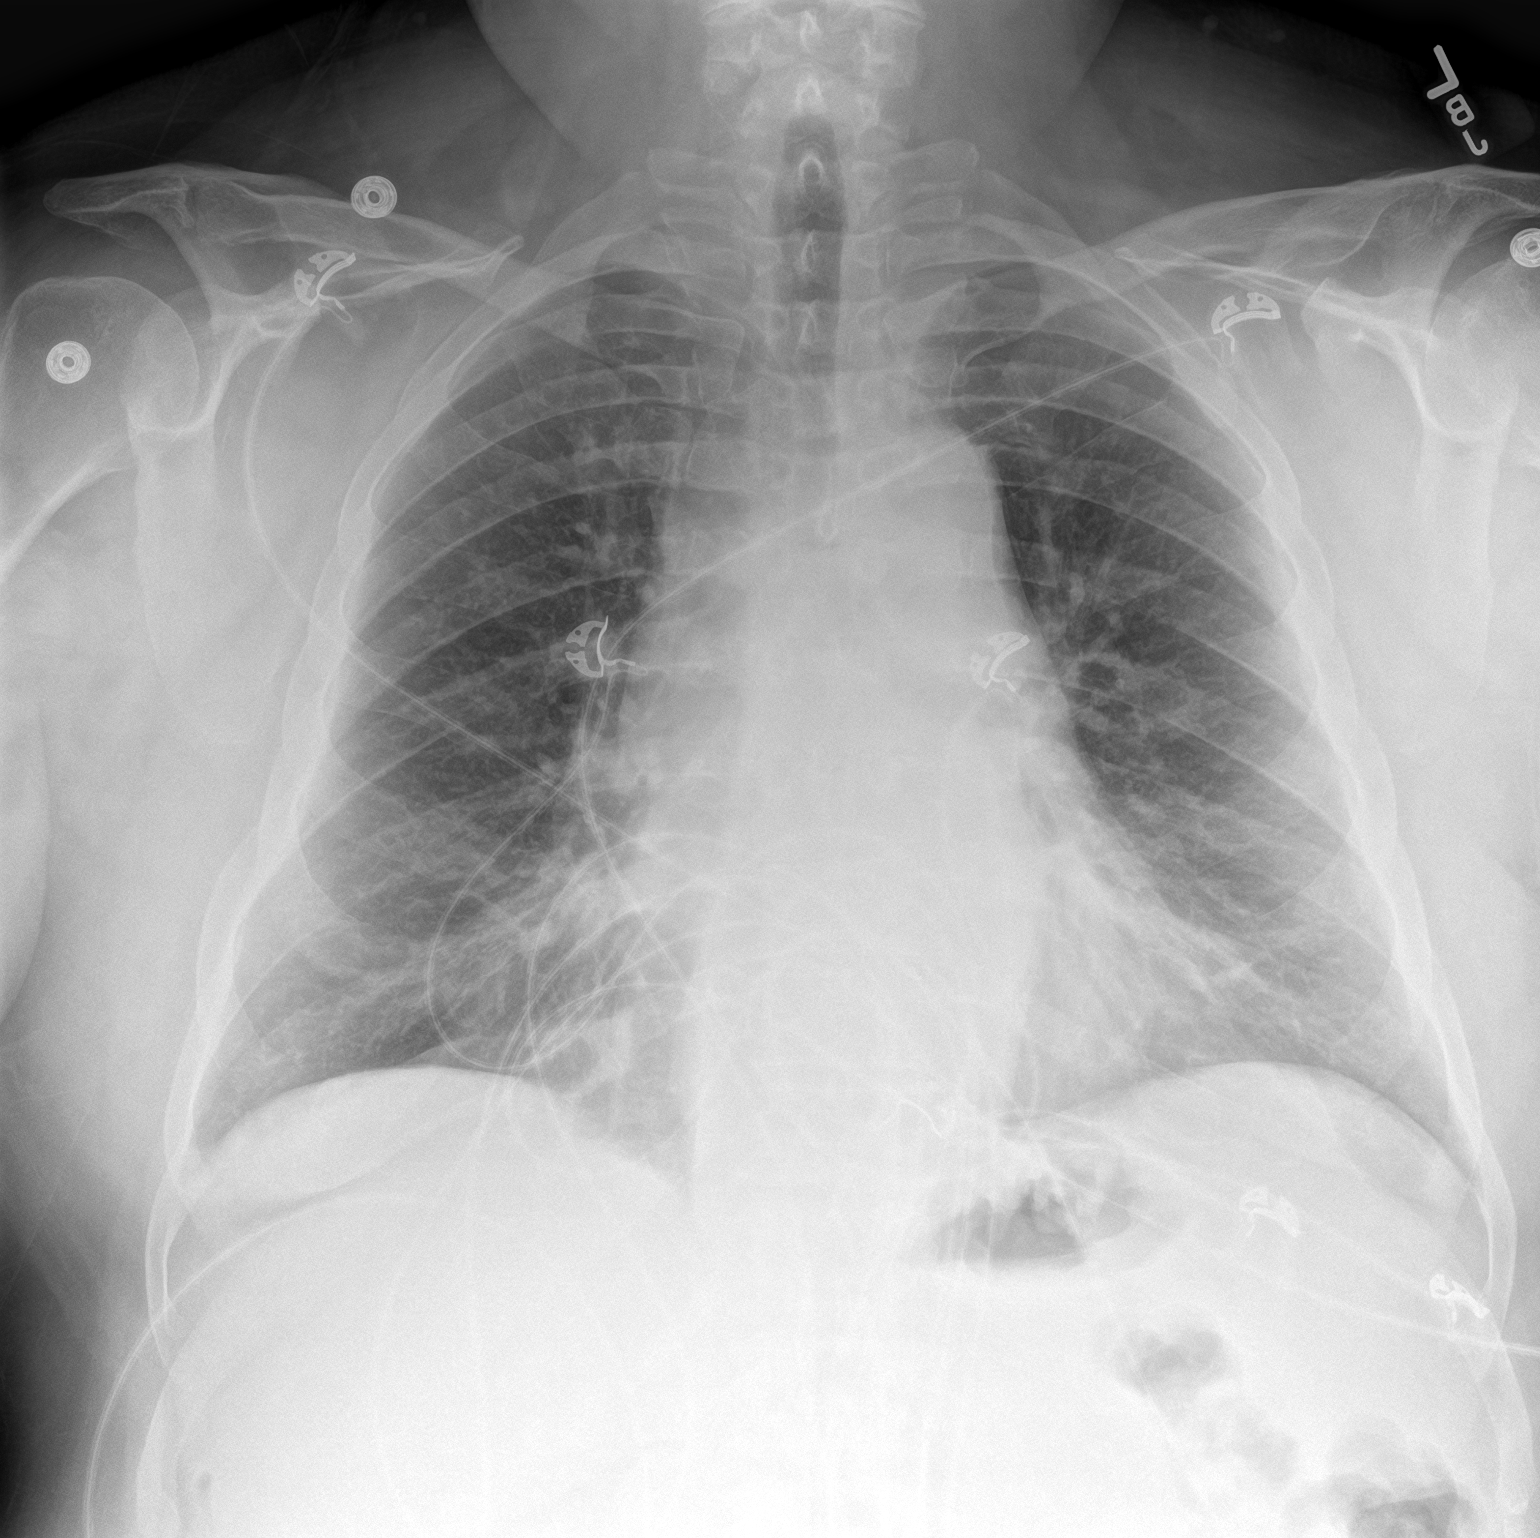

[chest lat]
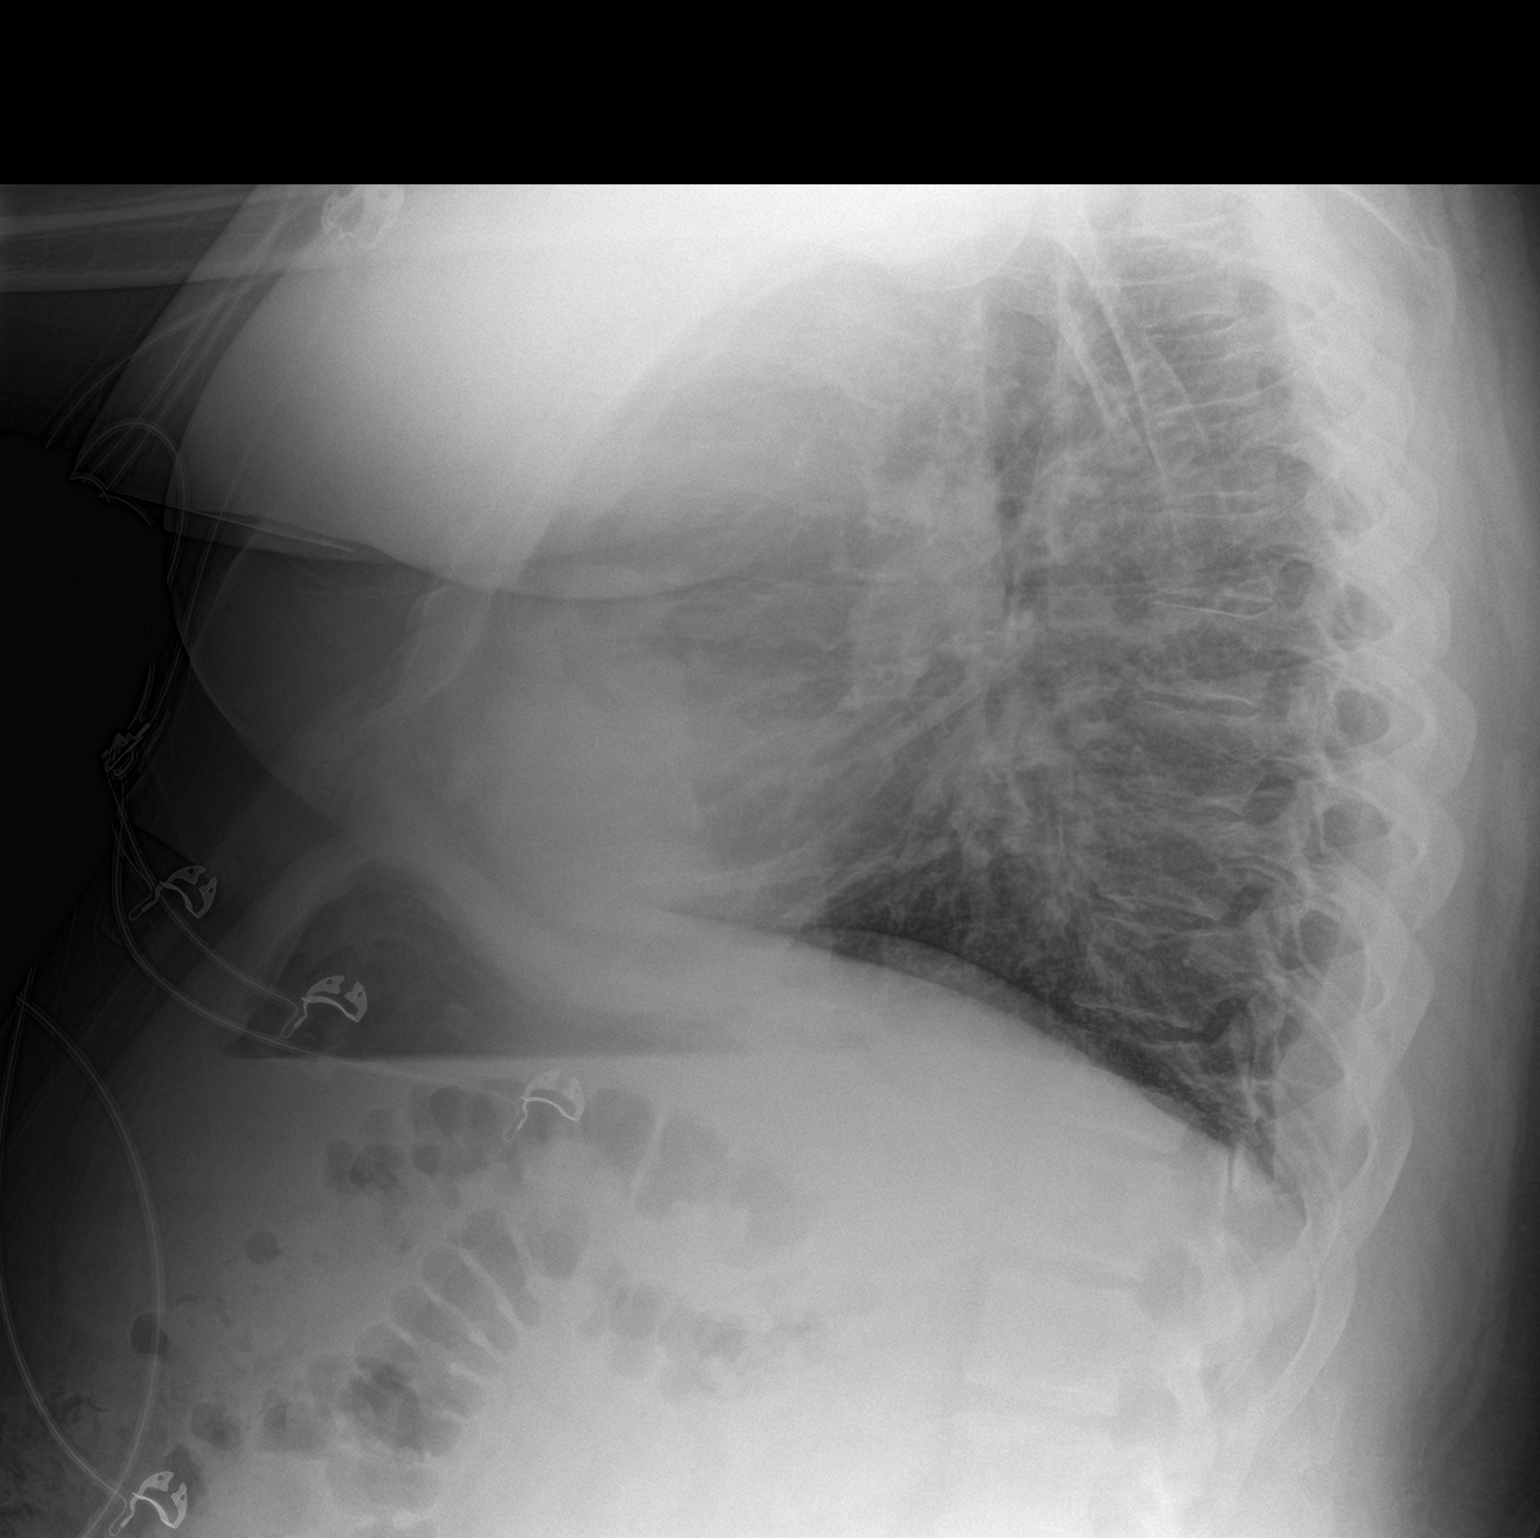

[2 of 2 positions shown; findings below may reference images not displayed]

FINDINGS: Heart and mediastinal contours are within normal limits. No focal
opacities or effusions. No acute bony abnormality.
IMPRESSION: No active cardiopulmonary disease.

## 2022-08-25 ENCOUNTER — Other Ambulatory Visit: Payer: Self-pay | Admitting: Cardiology

## 2022-10-01 ENCOUNTER — Encounter: Payer: Self-pay | Admitting: Nurse Practitioner

## 2022-10-01 ENCOUNTER — Ambulatory Visit: Payer: 59 | Attending: Nurse Practitioner | Admitting: Nurse Practitioner

## 2022-10-01 ENCOUNTER — Telehealth: Payer: Self-pay | Admitting: Nurse Practitioner

## 2022-10-01 VITALS — BP 130/70 | HR 76 | Ht 71.5 in | Wt 278.6 lb

## 2022-10-01 DIAGNOSIS — R0683 Snoring: Secondary | ICD-10-CM

## 2022-10-01 DIAGNOSIS — I10 Essential (primary) hypertension: Secondary | ICD-10-CM | POA: Diagnosis not present

## 2022-10-01 DIAGNOSIS — I483 Typical atrial flutter: Secondary | ICD-10-CM

## 2022-10-01 DIAGNOSIS — N1831 Chronic kidney disease, stage 3a: Secondary | ICD-10-CM

## 2022-10-01 DIAGNOSIS — R29818 Other symptoms and signs involving the nervous system: Secondary | ICD-10-CM

## 2022-10-01 DIAGNOSIS — R400 Somnolence: Secondary | ICD-10-CM

## 2022-10-01 NOTE — Telephone Encounter (Signed)
WatPAT one Precert

## 2022-10-01 NOTE — Progress Notes (Signed)
Cardiology Office Note:  .   Date:  10/01/2022 ID:  Mitchell Nichols, DOB Jan 17, 1965, MRN [id number] PCP: Rebekah Chesterfield, NP  [facility] Providers Cardiologist:  [name], MD    History of Present Illness: .   Mitchell Nichols is a 57 y.o. male with a PMH of A-flutter, s/p DCCV in 2022, hypertension, CKD, obesity, who presents today for overdue 37-month follow-up appointment.  Last seen by Dr. Diona Browner on December 10, 2021.  Was overall doing well at the time, did note some intermittent mild lower leg edema.  Today he presents for overdue 64-month follow-up appointment.  He states he is doing well. Denies any acute changes to his health. Does admit to some fatigue at times. STOP Bang Score 7. Denies any chest pain, shortness of breath, palpitations, syncope, presyncope, dizziness, orthopnea, PND, swelling or significant weight changes, acute bleeding, or claudication.  ROS: Negative. See HPI.   Studies Reviewed: Marland Kitchen   EKG:  EKG Interpretation Date/Time:  Thursday October 01 2022 15:51:08 EDT Ventricular Rate:  71 PR Interval:  202 QRS Duration:  88 QT Interval:  388 QTC Calculation: 421 R Axis:   86  Text Interpretation: Normal sinus rhythm Normal ECG When compared with ECG of 04-Apr-2021 03:03, PREVIOUS ECG IS PRESENT Confirmed by Sharlene Dory [phone]) on 10/01/2022 3:54:54 PM   Echo TEE 11/2020:   1. Left ventricular ejection fraction, by estimation, is 50 to 55%. The  left ventricle has low normal function.   2. Right ventricular systolic function is normal. The right ventricular  size is normal.   3. No left atrial/left atrial appendage thrombus was detected. The LAA  emptying velocity was 90 cm/s.   4. The mitral valve is grossly normal. Mild mitral valve regurgitation.   5. The aortic valve is tricuspid. Aortic valve regurgitation is not  visualized.   6. Transgastric views not obtained, scope withdrawn due to need for  respiratory support by anesthesia  team.  Risk Assessment/Calculations:    STOP-BANG score: 7 Physical Exam:   VS:  BP 130/70   Pulse 76   Ht 5' 11.5" (1.816 m)   Wt 278 lb 9.6 oz (126.4 kg)   SpO2 94%   BMI 38.32 kg/m    Wt Readings from Last 3 Encounters:  10/01/22 278 lb 9.6 oz (126.4 kg)  12/10/21 292 lb 9.6 oz (132.7 kg)  07/23/21 280 lb (127 kg)    GEN: Obese, 57 year old male in no acute distress NECK: No JVD; No carotid bruits CARDIAC: S1/S2, RRR, no murmurs, rubs, gallops RESPIRATORY:  Clear to auscultation without rales, wheezing or rhonchi  EXTREMITIES:  No edema; No deformity   ASSESSMENT AND PLAN: .    Typical A-flutter, s/p DCCV in 2022 Underwent successful cardioversion 2022.  Denies any tachycardia or palpitations.  EKG reveals sinus rhythm today.  Continue metoprolol succinate and Eliquis for stroke prevention.  Will request most recent labs from PCP's office. Heart healthy diet and regular cardiovascular exercise encouraged.   2.  Hypertension Blood pressure today 130/70.  Discussed SBP goal is less than 130. Discussed to monitor BP at home at least 2 hours after medications and sitting for 5-10 minutes.  Given BP log and salty 6 diet sheet.  Continue amlodipine, lisinopril, and metoprolol succinate.  He will let us know if his SBP is not at goal in the next 2 to 3 weeks.  It is not at goal, recommend increasing metoprolol succinate. Heart healthy diet and regular cardiovascular  exercise encouraged.   3.  Suspected sleep apnea, snoring, daytime sleepiness STOP-BANG score 7.  Strong suspicion for OSA.  Will arrange home sleep study for him.  4. CKD stage 3a No recent labs on file.  Will request most recent labs from PCPs office.  Avoid nephrotoxic agents.  Continue follow-up with PCP.     Dispo: Follow-up with Dr. Diona Browner or APP in 6 months or sooner if anything changes.   Signed, Sharlene Dory, NP

## 2022-10-01 NOTE — Patient Instructions (Addendum)
Medication Instructions:  Your physician recommends that you continue on your current medications as directed. Please refer to the Current Medication list given to you today.  Labwork: Requesting from PCP   Testing/Procedures: Watch Pat One ordered  Follow-Up: Your physician recommends that you schedule a follow-up appointment in: 6 Months   Any Other Special Instructions Will Be Listed Below (If Applicable).   If you need a refill on your cardiac medications before your next appointment, please call your pharmacy.

## 2022-10-08 ENCOUNTER — Other Ambulatory Visit: Payer: Self-pay | Admitting: Cardiology

## 2022-10-08 NOTE — Telephone Encounter (Addendum)
Prior Authorization for [facility] sent to [employer] via web portal. Tracking Number . NO PA REQ FOR HST.  Ordering provider: Sharlene Dory Associated diagnoses: R29.818,R40.0,R06.83 WatchPAT PA obtained on 10/08/2022 by Latrelle Dodrill, CMA. Authorization: No; tracking ID   Patient notified of PIN (1234) on 10/08/2022 via Notification Method: phone.(Text)  I HAVE NOT BEEN ABLE TO CONTACT THE PATIENT BY PHONE. A TEXT WAS SENT VIA MY PERSONAL PHONE THAT THE OFFICE OF [facility]K WAS TRYING TO REACH HIM WITH HIS PIN NUMBER.

## 2022-10-13 ENCOUNTER — Telehealth: Payer: Self-pay | Admitting: Nurse Practitioner

## 2022-10-13 ENCOUNTER — Encounter: Payer: Self-pay | Admitting: Nurse Practitioner

## 2022-10-13 NOTE — Telephone Encounter (Signed)
Patient verbalized understanding to go ahead and complete his at home sleep study  His temporary phone number is [phone]

## 2022-10-13 NOTE — Telephone Encounter (Signed)
Letter sent to contact office

## 2022-10-13 NOTE — Telephone Encounter (Signed)
Reminder message sent to myself to f/u on this

## 2022-10-23 ENCOUNTER — Other Ambulatory Visit: Payer: Self-pay | Admitting: Cardiology

## 2022-12-08 ENCOUNTER — Other Ambulatory Visit: Payer: Self-pay | Admitting: Cardiology

## 2022-12-08 NOTE — Telephone Encounter (Signed)
Prescription refill request for Eliquis received. Indication: A Flutter Last office visit: 10/01/22  Shawnie Dapper NP Scr: 1.32 on 04/04/21 Age: 57 Weight: 126.4kg  Based on above findings Eliquis 5mg  twice daily is the appropriate dose.  Refill approved.  Requested labs be done at next OV with Sharlene Dory NP.

## 2022-12-11 ENCOUNTER — Encounter (HOSPITAL_COMMUNITY): Payer: Self-pay

## 2022-12-11 ENCOUNTER — Emergency Department (HOSPITAL_COMMUNITY): Payer: 59

## 2022-12-11 ENCOUNTER — Emergency Department (HOSPITAL_COMMUNITY)
Admission: EM | Admit: 2022-12-11 | Discharge: 2022-12-11 | Disposition: A | Discharge status: Home / Self Care | Payer: 59 | Attending: Emergency Medicine | Admitting: Emergency Medicine

## 2022-12-11 ENCOUNTER — Other Ambulatory Visit: Payer: Self-pay

## 2022-12-11 DIAGNOSIS — R600 Localized edema: Secondary | ICD-10-CM | POA: Diagnosis not present

## 2022-12-11 DIAGNOSIS — I129 Hypertensive chronic kidney disease with stage 1 through stage 4 chronic kidney disease, or unspecified chronic kidney disease: Secondary | ICD-10-CM | POA: Insufficient documentation

## 2022-12-11 DIAGNOSIS — Z79899 Other long term (current) drug therapy: Secondary | ICD-10-CM | POA: Insufficient documentation

## 2022-12-11 DIAGNOSIS — Z7901 Long term (current) use of anticoagulants: Secondary | ICD-10-CM | POA: Diagnosis not present

## 2022-12-11 DIAGNOSIS — N189 Chronic kidney disease, unspecified: Secondary | ICD-10-CM | POA: Diagnosis not present

## 2022-12-11 DIAGNOSIS — Z20822 Contact with and (suspected) exposure to covid-19: Secondary | ICD-10-CM | POA: Diagnosis not present

## 2022-12-11 DIAGNOSIS — J069 Acute upper respiratory infection, unspecified: Secondary | ICD-10-CM | POA: Insufficient documentation

## 2022-12-11 DIAGNOSIS — I4892 Unspecified atrial flutter: Secondary | ICD-10-CM | POA: Insufficient documentation

## 2022-12-11 DIAGNOSIS — R0602 Shortness of breath: Secondary | ICD-10-CM | POA: Insufficient documentation

## 2022-12-11 DIAGNOSIS — R0981 Nasal congestion: Secondary | ICD-10-CM | POA: Diagnosis present

## 2022-12-11 DIAGNOSIS — M7989 Other specified soft tissue disorders: Secondary | ICD-10-CM | POA: Diagnosis not present

## 2022-12-11 LAB — CBC WITH DIFFERENTIAL/PLATELET
Abs Immature Granulocytes: 0.08 10*3/uL — ABNORMAL HIGH (ref 0.00–0.07)
Basophils Absolute: 0.1 10*3/uL (ref 0.0–0.1)
Basophils Relative: 1 %
Eosinophils Absolute: 0.5 10*3/uL (ref 0.0–0.5)
Eosinophils Relative: 6 %
HCT: 46.4 % (ref 39.0–52.0)
Hemoglobin: 14.9 g/dL (ref 13.0–17.0)
Immature Granulocytes: 1 %
Lymphocytes Relative: 21 %
Lymphs Abs: 2 10*3/uL (ref 0.7–4.0)
MCH: 30.5 pg (ref 26.0–34.0)
MCHC: 32.1 g/dL (ref 30.0–36.0)
MCV: 95.1 fL (ref 80.0–100.0)
Monocytes Absolute: 1.2 10*3/uL — ABNORMAL HIGH (ref 0.1–1.0)
Monocytes Relative: 12 %
Neutro Abs: 5.6 10*3/uL (ref 1.7–7.7)
Neutrophils Relative %: 59 %
Platelets: 179 10*3/uL (ref 150–400)
RBC: 4.88 MIL/uL (ref 4.22–5.81)
RDW: 12.3 % (ref 11.5–15.5)
WBC: 9.5 10*3/uL (ref 4.0–10.5)
nRBC: 0 % (ref 0.0–0.2)

## 2022-12-11 LAB — BASIC METABOLIC PANEL
Anion gap: 10 (ref 5–15)
BUN: 20 mg/dL (ref 6–20)
CO2: 25 mmol/L (ref 22–32)
Calcium: 9 mg/dL (ref 8.9–10.3)
Chloride: 102 mmol/L (ref 98–111)
Creatinine, Ser: 1.05 mg/dL (ref 0.61–1.24)
GFR, Estimated: >60 mL/min (ref >60)
Glucose, Bld: 108 mg/dL — ABNORMAL HIGH (ref 70–99)
Potassium: 3.5 mmol/L (ref 3.5–5.1)
Sodium: 137 mmol/L (ref 135–145)

## 2022-12-11 LAB — RESP PANEL BY RT-PCR (RSV, FLU A&B, COVID)  RVPGX2
Influenza A by PCR: NEGATIVE
Influenza B by PCR: NEGATIVE
Resp Syncytial Virus by PCR: NEGATIVE
SARS Coronavirus 2 by RT PCR: NEGATIVE

## 2022-12-11 LAB — BRAIN NATRIURETIC PEPTIDE: B Natriuretic Peptide: 35 pg/mL (ref 0.0–100.0)

## 2022-12-11 MED ORDER — FUROSEMIDE 20 MG PO TABS: 20.0000 mg | ORAL_TABLET | Freq: Every day | ORAL | 0 refills | Status: AC

## 2022-12-11 NOTE — ED Triage Notes (Signed)
Pt c/o SOB around 0430 this morning. Per family pt stated to her that he has not been sleeping well. This morning pt attempted to sit in recliner d/t his SOB and called sister because he has been feeling worse this week. Pt states BP 167/92 this morning.

## 2022-12-11 NOTE — Discharge Instructions (Addendum)
You were seen for your shortness of breath in the emergency department.  You likely have an upper respiratory tract infection.  At home, please take the Lasix we prescribed you.  This may help with your shortness of breath by getting fluid off.  Check your MyChart online for the results of any tests that had not resulted by the time you left the emergency department.   Follow-up with your primary doctor in 2-3 days regarding your visit.    Return immediately to the emergency department if you experience any of the following: Difficulty breathing, or any other concerning symptoms.    Thank you for visiting our Emergency Department. It was a pleasure taking care of you today.

## 2022-12-11 NOTE — ED Provider Notes (Signed)
Mitchell Nichols Provider Note   CSN: [id number] Arrival date & time: 12/11/22  7564     History  Chief Complaint  Patient presents with   Shortness of Breath    Mitchell Nichols is a 57 y.o. male.  57 year old male with a history of atrial flutter on metoprolol and Eliquis status post cardioversion, elevated BMI, OSA, hypertension, and CKD who presents emergency department shortness of breath.  Since the day before Thanksgiving has had congestion and cough productive of milky sputum.  Says that since then has just been feeling poorly.  Has been sleeping in his recliner due to shortness of breath.  Does have some leg swelling but says it is his baseline.  No fevers or chills.  Says that he ran out of Lasix prescription 2 to 3 weeks ago.       Home Medications Prior to Admission medications   Medication Sig Start Date End Date Taking? Authorizing Provider  furosemide (LASIX) 20 MG tablet Take 1 tablet (20 mg total) by mouth daily for 5 days. 12/11/22 12/16/22 Yes Rondel Baton, MD  amLODipine (NORVASC) 5 MG tablet TAKE ONE (1) TABLET BY MOUTH EVERY DAY 10/08/22   Jonelle Sidle, MD  ELIQUIS 5 MG TABS tablet TAKE ONE (1) TABLET BY MOUTH TWO (2) TIMES DAILY 12/08/22   Jonelle Sidle, MD  lisinopril (ZESTRIL) 20 MG tablet TAKE ONE (1) TABLET BY MOUTH TWO (2) TIMES DAILY 10/23/22   Jonelle Sidle, MD  metoprolol succinate (TOPROL-XL) 50 MG 24 hr tablet TAKE 1 TABLET BY MOUTH DAILY WITH FOOD 10/23/22   Jonelle Sidle, MD      Allergies    Patient has no known allergies.    Review of Systems   Review of Systems  Physical Exam Updated Vital Signs BP (!) 152/103   Pulse 65   Temp 97.6 F (36.4 C) (Oral)   Resp 16   Ht 5\' 11"  (1.803 m)   Wt 117.9 kg   SpO2 96%   BMI 36.26 kg/m  Physical Exam Vitals and nursing note reviewed.  Constitutional:      General: He is not in acute distress.    Appearance: He is  well-developed.  HENT:     Head: Normocephalic and atraumatic.     Right Ear: External ear normal.     Left Ear: External ear normal.     Nose: Congestion present.  Eyes:     Extraocular Movements: Extraocular movements intact.     Conjunctiva/sclera: Conjunctivae normal.     Pupils: Pupils are equal, round, and reactive to light.  Cardiovascular:     Rate and Rhythm: Normal rate and regular rhythm.     Heart sounds: Normal heart sounds. No murmur heard. Pulmonary:     Effort: Pulmonary effort is normal. No respiratory distress.     Breath sounds: Normal breath sounds.  Musculoskeletal:        General: No swelling.     Cervical back: Normal range of motion and neck supple.     Right lower leg: Edema present.     Left lower leg: Edema present.  Skin:    General: Skin is warm and dry.     Capillary Refill: Capillary refill takes less than 2 seconds.  Neurological:     Mental Status: He is alert. Mental status is at baseline.  Psychiatric:        Mood and Affect: Mood normal.  Behavior: Behavior normal.     ED Results / Procedures / Treatments   Labs (all labs ordered are listed, but only abnormal results are displayed) Labs Reviewed  CBC WITH DIFFERENTIAL/PLATELET - Abnormal; Notable for the following components:      Result Value   Monocytes Absolute 1.2 (*)    Abs Immature Granulocytes 0.08 (*)    All other components within normal limits  BASIC METABOLIC PANEL - Abnormal; Notable for the following components:   Glucose, Bld 108 (*)    All other components within normal limits  RESP PANEL BY RT-PCR (RSV, FLU A&B, COVID)  RVPGX2  BRAIN NATRIURETIC PEPTIDE    EKG EKG Interpretation Date/Time:  Friday December 11 2022 07:08:18 EST Ventricular Rate:  75 PR Interval:  182 QRS Duration:  93 QT Interval:  406 QTC Calculation: 454 R Axis:   88  Text Interpretation: Sinus rhythm Confirmed by Vonita Moss [phone]) on 12/11/2022 7:32:07 AM  Radiology DG Chest  2 View  Result Date: 12/11/2022 CLINICAL DATA:  57 year old male with cough and shortness of breath. EXAM: CHEST - 2 VIEW COMPARISON:  Chest radiographs 04/04/2021 and earlier. FINDINGS: Lung volumes and mediastinal contours are stable, within normal limits. Anterior cardiac fat pad suspected, normal variant. Visualized tracheal air column is within normal limits. No pneumothorax, pulmonary edema, pleural effusion or confluent lung opacity. No acute osseous abnormality identified. Negative visible bowel gas. IMPRESSION: No acute cardiopulmonary abnormality. Electronically Signed   By: Odessa Fleming M.D.   On: 12/11/2022 08:56    Procedures Procedures    Medications Ordered in ED Medications - No data to display  ED Course/ Medical Decision Making/ A&P                                 Medical Decision Making Amount and/or Complexity of Data Reviewed Labs: ordered. Radiology: ordered.  Risk Prescription drug management.   Mitchell Nichols is a 57 y.o. male with comorbidities that complicate the patient evaluation including atrial flutter on metoprolol and Eliquis status post cardioversion, elevated BMI, OSA, hypertension, and CKD who presents emergency department shortness of breath.   Initial Ddx:  URI, pneumonia, CHF, COPD, Bronchitis, PE   MDM/Course:  Pt presents with subacute cough and SOB. Lung sounds are CTAB though this is somewhat limited due to habitus. Satting well on room air and doesn't appear to be in resp distress. Does have some lower extremity edema.  Chest x-ray without pneumonia.  COVID and flu negative.  BNP WNL.  Suspect that the patient may have a lingering URI.  With his lower extremity edema feel he would benefit from several days of diuresis as well.  Did consider PE but patient is already anticoagulated.  Will him follow-up with his primary doctor.  This patient presents to the ED for concern of complaints listed in HPI, this involves an extensive number of treatment  options, and is a complaint that carries with it a high risk of complications and morbidity. Disposition including potential need for admission considered.   Dispo: DC Home. Return precautions discussed including, but not limited to, those listed in the AVS. Allowed pt time to ask questions which were answered fully prior to dc.  Additional history obtained from significant other Records reviewed Outpatient Clinic Notes The following labs were independently interpreted: Chemistry and show no acute abnormality I independently reviewed the following imaging with scope of interpretation limited to determining acute  life threatening conditions related to emergency care: Chest x-ray and agree with the radiologist interpretation with the following exceptions: none I personally reviewed and interpreted cardiac monitoring: normal sinus rhythm  I personally reviewed and interpreted the pt's EKG: see above for interpretation  I have reviewed the patients home medications and made adjustments as needed  Portions of this note were generated with Dragon dictation software. Dictation errors may occur despite best attempts at proofreading.     Final Clinical Impression(s) / ED Diagnoses Final diagnoses:  Viral upper respiratory tract infection  Lower extremity edema    Rx / DC Orders ED Discharge Orders          Ordered    furosemide (LASIX) 20 MG tablet  Daily        12/11/22 0927              Rondel Baton, MD 12/11/22 2110

## 2022-12-21 ENCOUNTER — Other Ambulatory Visit: Payer: Self-pay | Admitting: Cardiology

## 2023-02-19 ENCOUNTER — Other Ambulatory Visit: Payer: Self-pay | Admitting: Cardiology

## 2023-04-01 ENCOUNTER — Ambulatory Visit: Payer: 59 | Admitting: Nurse Practitioner

## 2023-04-05 ENCOUNTER — Ambulatory Visit: Payer: 59 | Attending: Nurse Practitioner | Admitting: Nurse Practitioner

## 2023-04-06 ENCOUNTER — Encounter: Payer: Self-pay | Admitting: Nurse Practitioner

## 2023-04-23 ENCOUNTER — Ambulatory Visit: Attending: Nurse Practitioner | Admitting: Nurse Practitioner

## 2023-04-23 ENCOUNTER — Encounter: Payer: Self-pay | Admitting: Nurse Practitioner

## 2023-04-23 VITALS — BP 120/82 | HR 64 | Ht 71.0 in | Wt 274.2 lb

## 2023-04-23 DIAGNOSIS — I483 Typical atrial flutter: Secondary | ICD-10-CM | POA: Insufficient documentation

## 2023-04-23 DIAGNOSIS — E669 Obesity, unspecified: Secondary | ICD-10-CM | POA: Diagnosis present

## 2023-04-23 DIAGNOSIS — I10 Essential (primary) hypertension: Secondary | ICD-10-CM | POA: Diagnosis present

## 2023-04-23 DIAGNOSIS — R29818 Other symptoms and signs involving the nervous system: Secondary | ICD-10-CM | POA: Insufficient documentation

## 2023-04-23 DIAGNOSIS — R0683 Snoring: Secondary | ICD-10-CM | POA: Insufficient documentation

## 2023-04-23 DIAGNOSIS — R400 Somnolence: Secondary | ICD-10-CM | POA: Insufficient documentation

## 2023-04-23 DIAGNOSIS — N1831 Chronic kidney disease, stage 3a: Secondary | ICD-10-CM | POA: Diagnosis present

## 2023-04-23 NOTE — Progress Notes (Signed)
 Cardiology Office Note:  .   Date:  10/01/2022 ID:  Mitchell Nichols, DOB 1965-04-11, MRN [id number] PCP: Lenn Quint, NP  [facility] Providers Cardiologist:  Teddie Favre, MD    History of Present Illness: .   Mitchell Nichols is a 58 y.o. male with a PMH of A-flutter, s/p DCCV in 2022, hypertension, CKD, obesity, who presents today for overdue 18-month follow-up appointment.  Last seen by Dr. Londa Rival on December 10, 2021.  Was overall doing well at the time, did note some intermittent mild lower leg edema.  Today he presents for overdue 15-month follow-up appointment.  He states he is doing well. Denies any acute changes to his health. Does admit to some fatigue at times. STOP Bang Score 7. Denies any chest pain, shortness of breath, palpitations, syncope, presyncope, dizziness, orthopnea, PND, swelling or significant weight changes, acute bleeding, or claudication.  ROS: Negative. See HPI.   Studies Reviewed: Aaron Aas   EKG:      Echo TEE 11/2020:   1. Left ventricular ejection fraction, by estimation, is 50 to 55%. The  left ventricle has low normal function.   2. Right ventricular systolic function is normal. The right ventricular  size is normal.   3. No left atrial/left atrial appendage thrombus was detected. The LAA  emptying velocity was 90 cm/s.   4. The mitral valve is grossly normal. Mild mitral valve regurgitation.   5. The aortic valve is tricuspid. Aortic valve regurgitation is not  visualized.   6. Transgastric views not obtained, scope withdrawn due to need for  respiratory support by anesthesia team.  Risk Assessment/Calculations:    STOP-BANG score: 7 Physical Exam:   VS:  BP 120/82   Pulse 64   Ht 5\' 11"  (1.803 m)   Wt 274 lb 3.2 oz (124.4 kg)   SpO2 98%   BMI 38.24 kg/m    Wt Readings from Last 3 Encounters:  04/23/23 274 lb 3.2 oz (124.4 kg)  12/11/22 260 lb (117.9 kg)  10/01/22 278 lb 9.6 oz (126.4 kg)    GEN: Obese, 58 year old male  in no acute distress NECK: No JVD; No carotid bruits CARDIAC: S1/S2, RRR, no murmurs, rubs, gallops RESPIRATORY:  Clear to auscultation without rales, wheezing or rhonchi  EXTREMITIES:  No edema; No deformity   ASSESSMENT AND PLAN: .    Typical A-flutter, s/p DCCV in 2022 Underwent successful cardioversion 2022.  Denies any tachycardia or palpitations.  EKG reveals sinus rhythm today.  Continue metoprolol  succinate and Eliquis  for stroke prevention.  Will request most recent labs from PCP's office. Heart healthy diet and regular cardiovascular exercise encouraged.   2.  Hypertension Blood pressure today 130/70.  Discussed SBP goal is less than 130. Discussed to monitor BP at home at least 2 hours after medications and sitting for 5-10 minutes.  Given BP log and salty 6 diet sheet.  Continue amlodipine , lisinopril , and metoprolol  succinate.  He will let us  know if his SBP is not at goal in the next 2 to 3 weeks.  It is not at goal, recommend increasing metoprolol  succinate. Heart healthy diet and regular cardiovascular exercise encouraged.   3.  Suspected sleep apnea, snoring, daytime sleepiness STOP-BANG score 7.  Strong suspicion for OSA.  Will arrange home sleep study for him.  4. CKD stage 3a No recent labs on file.  Will request most recent labs from PCPs office.  Avoid nephrotoxic agents.  Continue follow-up with PCP.  Dispo: Follow-up with Dr. Londa Rival or APP in 6 months or sooner if anything changes.   Signed, Lasalle Pointer, NP

## 2023-04-23 NOTE — Patient Instructions (Addendum)

## 2023-05-03 ENCOUNTER — Telehealth: Payer: Self-pay | Admitting: Cardiology

## 2023-05-03 MED ORDER — APIXABAN 5 MG PO TABS: 5.0000 mg | ORAL_TABLET | Freq: Two times a day (BID) | ORAL | 0 refills | Status: DC

## 2023-05-03 NOTE — Telephone Encounter (Signed)
 Packed and placed in closet patient will pickup today

## 2023-05-03 NOTE — Telephone Encounter (Signed)
 Patient should have insurance in 2 weeks he states  Enough samples will be provided or patient to have for 2 weeks

## 2023-05-03 NOTE — Addendum Note (Signed)
 Addended by: Solita Macadam on: 05/03/2023 09:57 AM   Modules accepted: Orders

## 2023-05-03 NOTE — Telephone Encounter (Signed)
 Patient calling the office for samples of medication:   1.  What medication and dosage are you requesting samples for? Eliquis   2.  Are you currently out of this medication? Need enough until his insurance kick in please

## 2023-05-03 NOTE — Telephone Encounter (Signed)
 Sample request for Eliquis  received. Indication: A Flutter Last office visit: 04/23/23  Donivan Furry NP Scr: 1.05 on 12/11/22  Epic Age: 58 Weight: 124.4kg  Based on above findings Eliquis  5mg  twice daily is the appropriate dose.  OK to provide samples if available.

## 2023-05-12 ENCOUNTER — Telehealth: Payer: Self-pay | Admitting: Cardiology

## 2023-05-12 NOTE — Telephone Encounter (Signed)
 Patient calling the office for samples of medication:   1.  What medication and dosage are you requesting samples for? Eliquis    2.  Are you currently out of this medication? Patient says he needs sample  until 06-06-23 please He says that is when he will have insurance

## 2023-05-12 NOTE — Telephone Encounter (Signed)
 Sample request for Eliquis  received. Indication: A Flutter Last office visit: 04/23/23  Donivan Furry NP Scr: 1.05 on 12/11/22  Epic Age: 58 Weight: 124.4kg  Based on above findings Eliquis  5mg  twice daily is the appropriate dose.  OK to provide samples if available.

## 2023-05-13 MED ORDER — APIXABAN 5 MG PO TABS: 5.0000 mg | ORAL_TABLET | Freq: Two times a day (BID) | ORAL | Status: DC

## 2023-05-13 NOTE — Telephone Encounter (Signed)
Advised that eliquis 5 mg samples are available for pick up.

## 2023-06-01 ENCOUNTER — Ambulatory Visit: Admitting: Nurse Practitioner

## 2023-06-15 ENCOUNTER — Other Ambulatory Visit: Payer: Self-pay | Admitting: Cardiology

## 2023-06-15 DIAGNOSIS — I483 Typical atrial flutter: Secondary | ICD-10-CM

## 2023-06-15 NOTE — Telephone Encounter (Signed)
 Prescription refill request for Eliquis  received. Indication: Aflutter Last office visit: 04/23/23 Clementine Cutting)  Scr: 1.05 (12/11/22)  Age: 58 Weight: 124.4kg  Appropriate dose. Refill sent.

## 2023-07-05 ENCOUNTER — Other Ambulatory Visit: Payer: Self-pay | Admitting: Cardiology

## 2023-09-11 ENCOUNTER — Other Ambulatory Visit: Payer: Self-pay | Admitting: Cardiology

## 2023-11-02 ENCOUNTER — Ambulatory Visit: Payer: Medicare (Managed Care) | Attending: Cardiology | Admitting: Cardiology

## 2023-11-02 ENCOUNTER — Encounter: Payer: Self-pay | Admitting: Cardiology

## 2023-11-02 VITALS — BP 134/86 | HR 62 | Ht 71.5 in | Wt 272.4 lb

## 2023-11-02 DIAGNOSIS — Z79899 Other long term (current) drug therapy: Secondary | ICD-10-CM | POA: Diagnosis not present

## 2023-11-02 DIAGNOSIS — I483 Typical atrial flutter: Secondary | ICD-10-CM | POA: Diagnosis not present

## 2023-11-02 DIAGNOSIS — I10 Essential (primary) hypertension: Secondary | ICD-10-CM

## 2023-11-02 NOTE — Patient Instructions (Addendum)
 Medication Instructions:  Your physician recommends that you continue on your current medications as directed. Please refer to the Current Medication list given to you today.  Labwork: BMET & CBC  Non-fasting [facility] ([address] [place]) or [facility]  Testing/Procedures: none  Follow-Up: Your physician recommends that you schedule a follow-up appointment in: 6 months  Any Other Special Instructions Will Be Listed Below (If Applicable). You have been referred to Dr. Cindie  If you need a refill on your cardiac medications before your next appointment, please call your pharmacy.

## 2023-11-02 NOTE — Progress Notes (Signed)
    Cardiology Office Note  Date: 11/02/2023   ID: Mitchell Nichols, Mitchell Nichols [birth year], MRN [id number]  History of Present Illness: DETRIC SCALISI is a 59 y.o. male last seen in April by Ms. Miriam NP, I reviewed her note.  Our last visit was in 2023.  He is here for a routine visit.  Reports no interval palpitations, no exertional chest pain or dyspnea on exertion beyond NYHA class II.  I reviewed his medications.  He reports no spontaneous bleeding problems on Eliquis  5 mg twice daily.  Plan to obtain follow-up lab work.  I reviewed his ECG today which shows normal sinus rhythm.  He did ask me today about possibility of a Watchman device instead of long-term anticoagulation.  I think he actually may be a good candidate for an atrial flutter ablation, and in the absence of any documented atrial fibrillation, he may be able to come off anticoagulation thereafter anyway.  Physical Exam: VS:  BP 134/86   Pulse 62   Ht 5' 11.5 (1.816 m)   Wt 272 lb 6.4 oz (123.6 kg)   SpO2 97%   BMI 37.46 kg/m , BMI Body mass index is 37.46 kg/m.  Wt Readings from Last 3 Encounters:  11/02/23 272 lb 6.4 oz (123.6 kg)  04/23/23 274 lb 3.2 oz (124.4 kg)  12/11/22 260 lb (117.9 kg)    General: Patient appears comfortable at rest. HEENT: Conjunctiva and lids normal. Neck: Supple, no elevated JVP or carotid bruits. Lungs: Clear to auscultation, nonlabored breathing at rest. Cardiac: Regular rate and rhythm, no S3 or significant systolic murmur. Extremities: No pitting edema.  ECG:  An ECG dated 12/11/2022 was personally reviewed today and demonstrated:  Sinus rhythm.  Labwork: 12/11/2022: B Natriuretic Peptide 35.0; BUN 20; Creatinine, Ser 1.05; Hemoglobin 14.9; Platelets 179; Potassium 3.5; Sodium 137   Other Studies Reviewed Today:  No interval cardiac testing for review today.  Assessment and Plan:  1.  Typical atrial flutter status post successful cardioversion in November 2022.  CHA2DS2-VASc  score is 1-2 (hypertension, prediabetes).  He has had no obvious recurrences, ECG shows sinus rhythm today.  He would prefer not to be on long-term anticoagulation and did ask about possibility of Watchman device implantation.  I think he may be a good candidate for an atrial flutter ablation however, and in the absence of any documented atrial fibrillation, he may actually be able to stop anticoagulation thereafter anyway.  I will refer him to EP for discussion regarding both potential options.  For now continue Eliquis  5 mg twice daily and Toprol -XL 50 mg daily.  Check CBC and BMET.   2.  Primary hypertension.  Continue Norvasc  5 mg daily and lisinopril  20 mg twice daily.  3.  History of CKD stage IIIa.  Creatinine 1.05 with GFR greater than 60 as of December 2024.  Disposition:  Follow up 6 months.  Signed, Jayson JUDITHANN Sierras, M.D., F.A.C.C. [facility] at [facility]

## 2023-11-09 ENCOUNTER — Telehealth: Payer: Self-pay | Admitting: Pharmacy Technician

## 2023-11-09 ENCOUNTER — Other Ambulatory Visit (HOSPITAL_COMMUNITY): Payer: Self-pay

## 2023-11-09 ENCOUNTER — Telehealth: Payer: Self-pay | Admitting: Cardiology

## 2023-11-09 NOTE — Telephone Encounter (Signed)
 Patient calling the office for samples of medication:   1.  What medication and dosage are you requesting samples for?  apixaban  (ELIQUIS ) 5 MG TABS tablet   2.  Are you currently out of this medication?   Yes  Patient stated he is now out of this medication and will need to get some samples.

## 2023-11-09 NOTE — Telephone Encounter (Signed)
 Lorrene approved, pharmacy and patient aware

## 2023-11-09 NOTE — Telephone Encounter (Signed)
 Spoke with patient to let him know that he had refills on file at the pharmacy. Stated that he was unable to afford co-pay at this time and has ran completely out. Routed over to Patient assistance to see if we can assist patient further.

## 2023-11-09 NOTE — Telephone Encounter (Signed)
   Per test claim eliquis  is 135.00 for 90 days and 45.00 for 30 days   Patient Advocate Encounter   The patient was approved for a [employer] grant that will help cover the cost of eliquis  Total amount awarded, 7500.  Effective: 10/10/23 - 10/08/24   APW:[id number] ERW:[id number] Hmnle:[id number] PI:[id number] [employer] ID: [id number]   Pharmacy provided with approval and processing information. Patient informed via telephone

## 2023-12-06 ENCOUNTER — Other Ambulatory Visit: Payer: Self-pay | Admitting: Cardiology

## 2023-12-06 DIAGNOSIS — I483 Typical atrial flutter: Secondary | ICD-10-CM

## 2023-12-06 NOTE — Telephone Encounter (Signed)
 Prescription refill request for Eliquis  received. Indication: AFlutter Last office visit: 11/02/23  GORMAN Sierras MD Scr: 1.05 on 12/11/22  Epic Age: 58 Weight: 123.6kg  Based on above findings Eliquis  5mg  twice daily is the appropriate dose.  Refill approved.

## 2024-01-07 ENCOUNTER — Telehealth: Payer: Self-pay

## 2024-01-07 ENCOUNTER — Ambulatory Visit
Attending: Student in an Organized Health Care Education/Training Program | Admitting: Student in an Organized Health Care Education/Training Program

## 2024-01-07 NOTE — Telephone Encounter (Signed)
 Left voicemail for patient to return call to reschedule appt.   Patient no showed OV 01/07/24 with Dr. Almetta. Last EKG 10/28 NEW Consult for Typical Atrial Flutter Ablation vs Watchman per Dr. Debera - ash....SABRACHART PREP RJ.......SEE 11/02/23 NOTE

## 2024-01-10 NOTE — Telephone Encounter (Signed)
 Left second voicemail for patient to reschedule OV with Dr. Almetta.

## 2024-01-20 NOTE — Telephone Encounter (Signed)
 Spoke w/ patient to reschedule his 1/2 consult that he no show'd with Dr. Almetta for ablation vs. watchman per Dr. Debera - patient states he had the flu. He is now rescheduled for 2/20 with Dr. Almetta in [place]. He was appreciative of call, asked for text with appointment info and inquired about a confirmation call, advised that will come 3 days prior to appointment.

## 2024-01-28 ENCOUNTER — Telehealth: Payer: Self-pay | Admitting: Cardiology

## 2024-01-28 NOTE — Telephone Encounter (Signed)
" °*  STAT* If patient is at the pharmacy, call can be transferred to refill team.   1. Which medications need to be refilled? (please list name of each medication and dose if known)           amLODipine  (NORVASC ) 5 MG tablet TAKE ONE (1) TABLET BY MOUTH EVERY DAY   ELIQUIS  5 MG TABS tablet TAKE ONE (1) TABLET BY MOUTH TWO (2) TIMES DAILY   lisinopril  (ZESTRIL ) 20 MG tablet TAKE ONE (1) TABLET BY MOUTH TWO (2) TIMES DAILY   metoprolol  succinate (TOPROL -XL) 50 MG 24 hr tablet TAKE 1 TABLET BY MOUTH DAILY WITH FOOD   NEW PHARMACY   4. Which pharmacy/location (including street and city if local pharmacy) is medication to be sent to?  [id number] - [place], [place] - [address]       5. Do they need a 30 day or 90 day supply? 90   "

## 2024-01-31 NOTE — Telephone Encounter (Signed)
 Left pt a message that he can have his prescriptions transferred from [facility] to [place], that one pharmacy would need to call the other to get them.

## 2024-02-25 ENCOUNTER — Ambulatory Visit: Admitting: Student in an Organized Health Care Education/Training Program
